# Patient Record
Sex: Female | Born: 1955 | Race: White | Hispanic: No | Marital: Married | Smoking: Former smoker
Health system: Southern US, Community
[De-identification: ages and names within clinical notes are randomized; demographics above are authoritative.]

## PROBLEM LIST (undated history)

## (undated) DIAGNOSIS — T7840XA Allergy, unspecified, initial encounter: Secondary | ICD-10-CM

## (undated) DIAGNOSIS — N200 Calculus of kidney: Secondary | ICD-10-CM

## (undated) DIAGNOSIS — Z9109 Other allergy status, other than to drugs and biological substances: Secondary | ICD-10-CM

## (undated) DIAGNOSIS — K219 Gastro-esophageal reflux disease without esophagitis: Secondary | ICD-10-CM

## (undated) DIAGNOSIS — L309 Dermatitis, unspecified: Secondary | ICD-10-CM

## (undated) HISTORY — DX: Calculus of kidney: N20.0

## (undated) HISTORY — PX: WISDOM TOOTH EXTRACTION: SHX21

## (undated) HISTORY — DX: Dermatitis, unspecified: L30.9

## (undated) HISTORY — DX: Gastro-esophageal reflux disease without esophagitis: K21.9

## (undated) HISTORY — DX: Allergy, unspecified, initial encounter: T78.40XA

## (undated) HISTORY — PX: LITHOTRIPSY: SUR834

## (undated) HISTORY — PX: TONSILLECTOMY: SUR1361

## (undated) HISTORY — DX: Other allergy status, other than to drugs and biological substances: Z91.09

---

## 1998-12-21 ENCOUNTER — Other Ambulatory Visit: Admission: RE | Admit: 1998-12-21 | Discharge: 1998-12-21 | Payer: Self-pay | Admitting: Internal Medicine

## 1999-02-08 ENCOUNTER — Encounter: Payer: Self-pay | Admitting: Internal Medicine

## 1999-02-08 ENCOUNTER — Encounter: Admission: RE | Admit: 1999-02-08 | Discharge: 1999-02-08 | Payer: Self-pay | Admitting: Internal Medicine

## 2001-01-29 ENCOUNTER — Encounter: Payer: Self-pay | Admitting: Internal Medicine

## 2001-01-29 ENCOUNTER — Encounter: Admission: RE | Admit: 2001-01-29 | Discharge: 2001-01-29 | Payer: Self-pay | Admitting: Internal Medicine

## 2002-08-12 ENCOUNTER — Other Ambulatory Visit: Admission: RE | Admit: 2002-08-12 | Discharge: 2002-08-12 | Payer: Self-pay | Admitting: Internal Medicine

## 2003-08-06 ENCOUNTER — Encounter: Admission: RE | Admit: 2003-08-06 | Discharge: 2003-08-06 | Payer: Self-pay | Admitting: Internal Medicine

## 2004-10-11 ENCOUNTER — Encounter: Admission: RE | Admit: 2004-10-11 | Discharge: 2004-10-11 | Payer: Self-pay | Admitting: Internal Medicine

## 2005-09-19 ENCOUNTER — Other Ambulatory Visit: Admission: RE | Admit: 2005-09-19 | Discharge: 2005-09-19 | Payer: Self-pay | Admitting: Internal Medicine

## 2005-10-12 ENCOUNTER — Ambulatory Visit: Payer: Self-pay | Admitting: Internal Medicine

## 2005-12-09 ENCOUNTER — Encounter (INDEPENDENT_AMBULATORY_CARE_PROVIDER_SITE_OTHER): Payer: Self-pay | Admitting: Specialist

## 2005-12-09 ENCOUNTER — Ambulatory Visit: Payer: Self-pay | Admitting: Internal Medicine

## 2006-05-25 ENCOUNTER — Encounter: Admission: RE | Admit: 2006-05-25 | Discharge: 2006-05-25 | Payer: Self-pay | Admitting: Internal Medicine

## 2007-06-22 ENCOUNTER — Encounter: Admission: RE | Admit: 2007-06-22 | Discharge: 2007-06-22 | Payer: Self-pay | Admitting: Internal Medicine

## 2008-07-28 ENCOUNTER — Encounter: Admission: RE | Admit: 2008-07-28 | Discharge: 2008-07-28 | Payer: Self-pay | Admitting: Internal Medicine

## 2008-09-22 ENCOUNTER — Other Ambulatory Visit: Admission: RE | Admit: 2008-09-22 | Discharge: 2008-09-22 | Payer: Self-pay | Admitting: Internal Medicine

## 2009-07-27 ENCOUNTER — Ambulatory Visit (HOSPITAL_COMMUNITY): Admission: RE | Admit: 2009-07-27 | Discharge: 2009-07-27 | Payer: Self-pay | Admitting: Urology

## 2009-08-03 ENCOUNTER — Encounter: Admission: RE | Admit: 2009-08-03 | Discharge: 2009-08-03 | Payer: Self-pay | Admitting: Internal Medicine

## 2009-08-31 ENCOUNTER — Ambulatory Visit (HOSPITAL_COMMUNITY): Admission: RE | Admit: 2009-08-31 | Discharge: 2009-08-31 | Payer: Self-pay | Admitting: Urology

## 2010-08-10 ENCOUNTER — Other Ambulatory Visit: Payer: Self-pay | Admitting: Internal Medicine

## 2010-08-10 DIAGNOSIS — Z1231 Encounter for screening mammogram for malignant neoplasm of breast: Secondary | ICD-10-CM

## 2010-08-17 ENCOUNTER — Ambulatory Visit
Admission: RE | Admit: 2010-08-17 | Discharge: 2010-08-17 | Disposition: A | Discharge status: Home / Self Care | Payer: BC Managed Care – PPO | Source: Ambulatory Visit | Attending: Internal Medicine | Admitting: Internal Medicine

## 2010-08-17 DIAGNOSIS — Z1231 Encounter for screening mammogram for malignant neoplasm of breast: Secondary | ICD-10-CM

## 2011-04-06 ENCOUNTER — Ambulatory Visit (INDEPENDENT_AMBULATORY_CARE_PROVIDER_SITE_OTHER): Payer: BC Managed Care – PPO | Admitting: Emergency Medicine

## 2011-04-06 VITALS — BP 110/68 | HR 80 | Temp 98.2°F | Resp 18 | Ht 64.0 in | Wt 115.0 lb

## 2011-04-06 DIAGNOSIS — B9789 Other viral agents as the cause of diseases classified elsewhere: Secondary | ICD-10-CM

## 2011-04-06 DIAGNOSIS — J329 Chronic sinusitis, unspecified: Secondary | ICD-10-CM

## 2011-04-06 DIAGNOSIS — N200 Calculus of kidney: Secondary | ICD-10-CM | POA: Insufficient documentation

## 2011-04-06 DIAGNOSIS — H9209 Otalgia, unspecified ear: Secondary | ICD-10-CM

## 2011-04-06 DIAGNOSIS — J019 Acute sinusitis, unspecified: Secondary | ICD-10-CM

## 2011-04-06 MED ORDER — AZELASTINE HCL 0.1 % NA SOLN: NASAL | Status: DC

## 2011-04-06 MED ORDER — AZITHROMYCIN 250 MG PO TABS: ORAL_TABLET | ORAL | Status: AC

## 2011-04-06 NOTE — Progress Notes (Signed)
  Subjective:    Patient ID: Yvonne Bryant, female    DOB: 15-Jul-1955, 56 y.o.   MRN: [id number]  HPI patient presents with head congestion nasal congestion postnasal drip and left ear pain. Last night the patient had an episode of vertigo which was associated with. Those symptoms have resolved today.    Review of Systems review of systems is noncontributory. She has a history kidney stones but otherwise has been in good health     Objective:   Physical Exam  Constitutional: She appears well-developed and well-nourished.  HENT:  Right Ear: External ear normal.       Examination left ear shows that the drum is somewhat dull but I did not see an air-fluid level or redness  Neck: Normal range of motion. No tracheal deviation present. No thyromegaly present.  Cardiovascular: Normal rate and regular rhythm.   Pulmonary/Chest: Effort normal and breath sounds normal. No respiratory distress. She has no wheezes. She has no rales. She exhibits no tenderness.  Lymphadenopathy:    She has no cervical adenopathy.          Assessment & Plan:   Assessment is an upper restaurant infection with probable left maxillary sinusitis and left ear pain. We'll treat with a Z-Pak and asked to prone see if we can open up her eustachian tube. I did advise her she could Bonine over-the-counter to use for her vertigo

## 2011-08-17 ENCOUNTER — Other Ambulatory Visit: Payer: Self-pay | Admitting: Internal Medicine

## 2011-08-17 DIAGNOSIS — Z1231 Encounter for screening mammogram for malignant neoplasm of breast: Secondary | ICD-10-CM

## 2011-08-30 ENCOUNTER — Ambulatory Visit
Admission: RE | Admit: 2011-08-30 | Discharge: 2011-08-30 | Disposition: A | Discharge status: Home / Self Care | Payer: BC Managed Care – PPO | Source: Ambulatory Visit | Attending: Internal Medicine | Admitting: Internal Medicine

## 2011-08-30 DIAGNOSIS — Z1231 Encounter for screening mammogram for malignant neoplasm of breast: Secondary | ICD-10-CM

## 2011-09-28 ENCOUNTER — Other Ambulatory Visit (HOSPITAL_COMMUNITY)
Admission: RE | Admit: 2011-09-28 | Discharge: 2011-09-28 | Disposition: A | Discharge status: Home / Self Care | Payer: BC Managed Care – PPO | Source: Ambulatory Visit | Attending: Internal Medicine | Admitting: Internal Medicine

## 2011-09-28 DIAGNOSIS — Z01419 Encounter for gynecological examination (general) (routine) without abnormal findings: Secondary | ICD-10-CM | POA: Insufficient documentation

## 2011-11-07 ENCOUNTER — Ambulatory Visit (INDEPENDENT_AMBULATORY_CARE_PROVIDER_SITE_OTHER): Payer: BC Managed Care – PPO | Admitting: Physician Assistant

## 2011-11-07 VITALS — BP 129/70 | HR 74 | Temp 98.1°F | Resp 16 | Ht 63.38 in | Wt 116.4 lb

## 2011-11-07 DIAGNOSIS — J019 Acute sinusitis, unspecified: Secondary | ICD-10-CM

## 2011-11-07 MED ORDER — IPRATROPIUM BROMIDE 0.06 % NA SOLN: 2.0000 | Freq: Three times a day (TID) | NASAL | Status: DC

## 2011-11-07 MED ORDER — AZITHROMYCIN 250 MG PO TABS: ORAL_TABLET | ORAL | Status: DC

## 2011-11-07 NOTE — Progress Notes (Signed)
Patient ID: Yvonne Bryant MRN: [id number], DOB: September 04, 1955, 56 y.o. Date of Encounter: 11/07/2011, 8:22 PM  Primary Physician: No primary provider on file.  Chief Complaint:  Chief Complaint  Patient presents with  . Nasal Congestion    x 2 weeks     HPI: 56 y.o. year old female presents with a 15 day history of post nasal drip. Symptoms began with a sore throat that lasted for a couple of days then progressed into nasal congestion for a few days. After the initial couple of days her sore throat resolved. Her nasal congestion has since resolved, however she is left with the post nasal drip. Never with much sinus pressure.  Afebrile. No chills. Post nasal drip is worse in the morning. Normal hearing. Has tried OTC cold preps without success. No GI complaints. Appetite normal.   No recent antibiotics, recent travels, or sick contacts   No leg trauma, sedentary periods, h/o cancer, or tobacco use.  Past Medical History  Diagnosis Date  . Kidney stones   . Environmental allergies      Home Meds: Prior to Admission medications   Medication Sig Start Date End Date Taking? Authorizing Provider  potassium citrate (UROCIT-K) 10 MEQ (1080 MG) SR tablet Take 10 mEq by mouth 3 (three) times daily with meals.   Yes Historical Provider, MD  azelastine (ASTELIN) 137 MCG/SPRAY nasal spray Use in each nostril as directed. Place 2 puffs each side of the nose twice a day 04/06/11   Collene Gobble, MD                  Allergies:  Allergies  Allergen Reactions  . Iodine   . Prednisone     History   Social History  . Marital Status: Single    Spouse Name: N/A    Number of Children: N/A  . Years of Education: N/A   Occupational History  . Not on file.   Social History Main Topics  . Smoking status: Former Smoker -- 1.0 packs/day for 20 years    Types: Cigarettes    Quit date: 04/06/2003  . Smokeless tobacco: Not on file  . Alcohol Use: Yes     rarely drinks  . Drug Use: No  .  Sexually Active: Yes     1 partner in last 12 months   Other Topics Concern  . Not on file   Social History Narrative  . No narrative on file     Review of Systems: Constitutional: negative for chills, fever, night sweats or weight changes Cardiovascular: negative for chest pain or palpitations Respiratory: negative for hemoptysis, wheezing, or shortness of breath Abdominal: negative for abdominal pain, nausea, vomiting or diarrhea Dermatological: negative for rash Neurologic: negative for headache   Physical Exam: Blood pressure 129/70, pulse 74, temperature 98.1 F (36.7 C), temperature source Oral, resp. rate 16, height 5' 3.38" (1.61 m), weight 116 lb 6.4 oz (52.799 kg), SpO2 100.00%., Body mass index is 20.37 kg/(m^2). General: Well developed, well nourished, in no acute distress. Head: Normocephalic, atraumatic, eyes without discharge, sclera non-icteric, nares are congested. Bilateral auditory canals clear, TM's are without perforation, pearly grey with reflective cone of light bilaterally. Maxillary sinus TTP. Oral cavity moist, dentition normal. Posterior pharynx with post nasal drip and mild erythema. No peritonsillar abscess or tonsillar exudate. Uvula midline.  Neck: Supple. No thyromegaly. Full ROM. No lymphadenopathy. Lungs: Clear bilaterally to auscultation without wheezes, rales, or rhonchi. Breathing is unlabored.  Heart: RRR  with S1 S2. No murmurs, rubs, or gallops appreciated. Msk:  Strength and tone normal for age. Extremities: No clubbing or cyanosis. No edema. Neuro: Alert and oriented X 3. Moves all extremities spontaneously. CNII-XII grossly in tact. Psych:  Responds to questions appropriately with a normal affect.     ASSESSMENT AND PLAN:  56 y.o. year old female with sinusitis -Azithromycin 250 MG #6 2 po first day then 1 po next 4 days no RF -Atrovent NS 0.06% 2 sprays each nare bid prn #1 no RF -Tylenol/Motrin prn -Rest/fluids -RTC precautions -RTC  3-5 days if no improvement  Signed, Eula Listen, PA-C 11/07/2011 8:22 PM

## 2012-08-01 ENCOUNTER — Other Ambulatory Visit: Payer: Self-pay

## 2012-08-01 DIAGNOSIS — Z1231 Encounter for screening mammogram for malignant neoplasm of breast: Secondary | ICD-10-CM

## 2012-08-02 ENCOUNTER — Ambulatory Visit (INDEPENDENT_AMBULATORY_CARE_PROVIDER_SITE_OTHER): Payer: BC Managed Care – PPO | Admitting: Emergency Medicine

## 2012-08-02 VITALS — BP 120/60 | HR 65 | Temp 97.8°F | Resp 16 | Ht 63.5 in | Wt 118.4 lb

## 2012-08-02 DIAGNOSIS — M791 Myalgia, unspecified site: Secondary | ICD-10-CM

## 2012-08-02 DIAGNOSIS — IMO0001 Reserved for inherently not codable concepts without codable children: Secondary | ICD-10-CM

## 2012-08-02 NOTE — Progress Notes (Signed)
  Subjective:    Patient ID: Yvonne Bryant, female    DOB: 03/02/1955, 57 y.o.   MRN: [id number]  HPI  Pt presents with sxs that started 20 days ago with extreme fatigue, leg heaviness, HA that lasted up until 3 days ago, and blurry vision that lasted a week and is now coming and going No fever, cough, myalgias, rashes or tick bites. 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   Review of Systems     Objective:   Physical Exam        Assessment & Plan:

## 2012-09-03 ENCOUNTER — Ambulatory Visit: Payer: BC Managed Care – PPO

## 2012-09-12 ENCOUNTER — Encounter: Payer: Self-pay | Admitting: Internal Medicine

## 2012-09-18 ENCOUNTER — Ambulatory Visit
Admission: RE | Admit: 2012-09-18 | Discharge: 2012-09-18 | Disposition: A | Discharge status: Home / Self Care | Payer: BC Managed Care – PPO | Source: Ambulatory Visit

## 2012-09-18 DIAGNOSIS — Z1231 Encounter for screening mammogram for malignant neoplasm of breast: Secondary | ICD-10-CM

## 2013-04-05 ENCOUNTER — Ambulatory Visit (INDEPENDENT_AMBULATORY_CARE_PROVIDER_SITE_OTHER): Payer: BC Managed Care – PPO | Admitting: Physician Assistant

## 2013-04-05 VITALS — BP 110/70 | HR 91 | Temp 98.3°F | Resp 18 | Ht 62.0 in | Wt 122.0 lb

## 2013-04-05 DIAGNOSIS — M545 Low back pain, unspecified: Secondary | ICD-10-CM

## 2013-04-05 DIAGNOSIS — N390 Urinary tract infection, site not specified: Secondary | ICD-10-CM

## 2013-04-05 DIAGNOSIS — R6883 Chills (without fever): Secondary | ICD-10-CM

## 2013-04-05 DIAGNOSIS — R300 Dysuria: Secondary | ICD-10-CM

## 2013-04-05 LAB — POCT UA - MICROSCOPIC ONLY
Casts, Ur, LPF, POC: NEGATIVE
Crystals, Ur, HPF, POC: NEGATIVE
MUCUS UA: NEGATIVE
Yeast, UA: NEGATIVE

## 2013-04-05 LAB — POCT URINALYSIS DIPSTICK
BILIRUBIN UA: NEGATIVE
GLUCOSE UA: NEGATIVE
KETONES UA: NEGATIVE
NITRITE UA: NEGATIVE
Protein, UA: 30
SPEC GRAV UA: 1.02
Urobilinogen, UA: 0.2
pH, UA: 6

## 2013-04-05 MED ORDER — NITROFURANTOIN MONOHYD MACRO 100 MG PO CAPS: 100.0000 mg | ORAL_CAPSULE | Freq: Two times a day (BID) | ORAL | Status: DC

## 2013-04-05 NOTE — Progress Notes (Signed)
   Subjective:    Patient ID: Yvonne Bryant, female    DOB: [birth year], 58 y.o.   MRN: [id number]  HPI Pt presents to clinic with about 1 week h/o urinary symptoms consistent with a UTI.  She had an UTI about 1 year ago.  She developed some low back pain last pm but it was bilateral.  She has used no OTC medications.  Review of Systems  Gastrointestinal: Positive for abdominal pain.  Genitourinary: Positive for dysuria, urgency and frequency. Negative for vaginal discharge.  Musculoskeletal: Positive for back pain.       Objective:   Physical Exam  Vitals reviewed. Constitutional: She is oriented to person, place, and time. She appears well-developed and well-nourished.  HENT:  Head: Normocephalic and atraumatic.  Right Ear: External ear normal.  Left Ear: External ear normal.  Eyes: Conjunctivae are normal.  Cardiovascular: Normal rate, regular rhythm and normal heart sounds.   No murmur heard. Pulmonary/Chest: Effort normal and breath sounds normal. She has no wheezes.  Abdominal: Soft. There is tenderness (mild) in the suprapubic area. There is no CVA tenderness.  Neurological: She is alert and oriented to person, place, and time.  Skin: Skin is warm and dry.  Psychiatric: She has a normal mood and affect. Her behavior is normal. Judgment and thought content normal.   Results for orders placed in visit on 04/05/13  POCT URINALYSIS DIPSTICK      Result Value Ref Range   Color, UA yellow     Clarity, UA cloudy     Glucose, UA neg     Bilirubin, UA neg     Ketones, UA neg     Spec Grav, UA 1.020     Blood, UA mod.     pH, UA 6.0     Protein, UA 30     Urobilinogen, UA 0.2     Nitrite, UA neg     Leukocytes, UA large (3+)    POCT UA - MICROSCOPIC ONLY      Result Value Ref Range   WBC, Ur, HPF, POC 1-4     RBC, urine, microscopic 0-2     Bacteria, U Microscopic 1+     Mucus, UA neg     Epithelial cells, urine per micros 0-4     Crystals, Ur, HPF, POC neg     Casts, Ur, LPF, POC neg     Yeast, UA neg         Assessment & Plan:  Burning with urination - Plan: POCT urinalysis dipstick, POCT UA - Microscopic Only  Lower back pain - Plan: POCT urinalysis dipstick, POCT UA - Microscopic Only  Chills - Plan: POCT urinalysis dipstick, POCT UA - Microscopic Only  UTI (urinary tract infection) - Plan: nitrofurantoin, macrocrystal-monohydrate, (MACROBID) 100 MG capsule, Urine culture  Push fluids -   Windell Hummingbird PA-C  [facility] 04/05/2013 8:46 AM

## 2013-04-07 LAB — URINE CULTURE: Colony Count: >=100000

## 2013-04-08 ENCOUNTER — Other Ambulatory Visit: Payer: Self-pay | Admitting: Physician Assistant

## 2013-04-08 ENCOUNTER — Other Ambulatory Visit: Payer: Self-pay

## 2013-04-08 MED ORDER — CIPROFLOXACIN HCL 500 MG PO TABS: 500.0000 mg | ORAL_TABLET | Freq: Two times a day (BID) | ORAL | Status: DC

## 2013-06-05 ENCOUNTER — Other Ambulatory Visit: Payer: Self-pay | Admitting: Urology

## 2013-06-17 ENCOUNTER — Encounter (HOSPITAL_COMMUNITY): Payer: Self-pay | Admitting: Pharmacy Technician

## 2013-07-04 ENCOUNTER — Encounter (HOSPITAL_COMMUNITY): Payer: Self-pay | Admitting: *Deleted

## 2013-07-08 ENCOUNTER — Ambulatory Visit (HOSPITAL_COMMUNITY)
Admission: RE | Admit: 2013-07-08 | Discharge: 2013-07-08 | Disposition: A | Discharge status: Home / Self Care | Payer: BC Managed Care – PPO | Source: Ambulatory Visit | Attending: Urology | Admitting: Urology

## 2013-07-08 ENCOUNTER — Ambulatory Visit (HOSPITAL_COMMUNITY): Payer: BC Managed Care – PPO

## 2013-07-08 ENCOUNTER — Encounter (HOSPITAL_COMMUNITY)
Admission: RE | Disposition: A | Discharge status: Home / Self Care | Payer: Self-pay | Source: Ambulatory Visit | Attending: Urology

## 2013-07-08 ENCOUNTER — Encounter (HOSPITAL_COMMUNITY): Payer: Self-pay | Admitting: General Practice

## 2013-07-08 DIAGNOSIS — Z79899 Other long term (current) drug therapy: Secondary | ICD-10-CM | POA: Insufficient documentation

## 2013-07-08 DIAGNOSIS — N201 Calculus of ureter: Secondary | ICD-10-CM | POA: Insufficient documentation

## 2013-07-08 DIAGNOSIS — N200 Calculus of kidney: Secondary | ICD-10-CM | POA: Insufficient documentation

## 2013-07-08 DIAGNOSIS — Z87891 Personal history of nicotine dependence: Secondary | ICD-10-CM | POA: Insufficient documentation

## 2013-07-08 DIAGNOSIS — R3129 Other microscopic hematuria: Secondary | ICD-10-CM | POA: Insufficient documentation

## 2013-07-08 SURGERY — LITHOTRIPSY, ESWL
Anesthesia: LOCAL | Laterality: Left

## 2013-07-08 MED ORDER — DEXTROSE-NACL 5-0.45 % IV SOLN
INTRAVENOUS | Status: DC
  Administered 2013-07-08: 10:00:00 via INTRAVENOUS

## 2013-07-08 MED ORDER — CIPROFLOXACIN HCL 500 MG PO TABS
500.0000 mg | ORAL_TABLET | ORAL | Status: AC
  Administered 2013-07-08: 500 mg via ORAL
  Filled 2013-07-08: qty 1

## 2013-07-08 MED ORDER — DIPHENHYDRAMINE HCL 25 MG PO CAPS
25.0000 mg | ORAL_CAPSULE | ORAL | Status: AC
  Administered 2013-07-08: 25 mg via ORAL
  Filled 2013-07-08: qty 1

## 2013-07-08 MED ORDER — DIAZEPAM 5 MG PO TABS
10.0000 mg | ORAL_TABLET | ORAL | Status: AC
  Administered 2013-07-08: 10 mg via ORAL
  Filled 2013-07-08: qty 2

## 2013-07-08 NOTE — H&P (Signed)
Reason For Visit 6 mo f/u & KUB   Active Problems Problems  1. Bilateral kidney stones (592.0)   Assessed By: Jimmey Ralph (Urology); Last Assessed: 02 Nov 2012 2. Microscopic hematuria (599.72)   Assessed By: Jimmey Ralph (Urology); Last Assessed: 02 Nov 2012  History of Present Illness    58 YO female returns today for a 6 mo f/u & KUB with a hx of bilateral kidney stones & microhematuria. Currently taking Potassium Citrate 5 MEQ 3 po QID. S/P Lt lithotripsy for L renal stone on 08/31/09 and post R lithotripsy 07/27/09.   Past Medical History Problems  1. History of No Medical Problems  Surgical History Problems  1. History of Lithotripsy 2. History of Lithotripsy  Current Meds 1. Potassium Citrate ER 5 MEQ (540 MG) Oral Tablet Extended Release; TAKE 3 TABLETS  4 TIMES DAILY WITH MEALS;  Therapy: [id number] to (Evaluate:19Oct2015)  Requested for: 24Oct2014; Last  Rx:24Oct2014 Ordered  Allergies Medication  1. PredniSONE (Pak) TABS Non-Medication  2. Shellfish  Family History Problems  1. Family history of Family Health Status - Father's Age   61 2. Family history of Mother Deceased At Age ____   46 3. Family history of Uterine Cancer (V16.49) : Mother  Social History Problems  1. Denied: History of Alcohol Use 2. Denied: History of Caffeine Use 3. Former smoker (V15.82)   1 ppd x 23 yrs, quit 6 yrs ago 4. Marital History - Currently Married 5. Occupation:   Glass blower/designer  Review of Systems Genitourinary, constitutional, skin, eye, otolaryngeal, hematologic/lymphatic, cardiovascular, pulmonary, endocrine, musculoskeletal, gastrointestinal, neurological and psychiatric system(s) were reviewed and pertinent findings if present are noted.    Vitals Vital Signs [Data Includes: Last 1 Day]  Recorded: 29Apr2015 03:02PM  Blood Pressure: 121 / 72 Temperature: 98.2 F Heart Rate: 97  Physical Exam Constitutional: Well nourished and well developed . No  acute distress.  ENT:. The ears and nose are normal in appearance.  Neck: The appearance of the neck is normal and no neck mass is present.  Pulmonary: No respiratory distress and normal respiratory rhythm and effort.  Cardiovascular:. No peripheral edema.  Abdomen: The abdomen is flat. The abdomen is soft and nontender. No masses are palpated. No CVA tenderness. No hernias are palpable. No hepatosplenomegaly noted.  Lymphatics: The femoral and inguinal nodes are not enlarged or tender.  Skin: Normal skin turgor, no visible rash and no visible skin lesions.  Neuro/Psych:. Mood and affect are appropriate.    Results/Data  KUB: Suspicious 1cm L renal pelvic Ca++, medial and lateral, vs stool overlying the kidney. Ribs: no evidence for fx. Gas pattern normal.   08 May 2013 2:33 PM    UA With REFLEX      COLOR YELLOW      APPEARANCE CLEAR      SPECIFIC GRAVITY 1.010      pH 6.0      GLUCOSE NEG      BILIRUBIN NEG      KETONE NEG      BLOOD NEG      PROTEIN NEG      UROBILINOGEN 0.2      NITRITE NEG      LEUKOCYTE ESTERASE NEG  Assessment Assessed  1. Kidney stone on left side (592.0)  Suspicious KUB for L renal stones. She needs CT stone protocol.   Plan Kidney stone on left side  1. AU CT-STONE PROTOCOL; Status:Hold For - Appointment,PreCert,Date of Service,Print;  Requested for:29Apr2015;   CT  stone protocol. We will call with results.   Discussion/Summary cc: [facility] [place]   cc: Dr. Grayland Ormond     Signatures

## 2013-07-08 NOTE — Interval H&P Note (Signed)
History and Physical Interval Note:  07/08/2013 9:09 AM  Yvonne Bryant  has presented today for surgery, with the diagnosis of left renal and ureteral stone  The various methods of treatment have been discussed with the patient and family. After consideration of risks, benefits and other options for treatment, the patient has consented to  Procedure(s): LEFT EXTRACORPOREAL SHOCK WAVE LITHOTRIPSY (ESWL) (Left) as a surgical intervention .  The patient's history has been reviewed, patient examined, no change in status, stable for surgery.  I have reviewed the patient's chart and labs.  Questions were answered to the patient's satisfaction.     Carolan Clines I

## 2013-08-13 ENCOUNTER — Other Ambulatory Visit: Payer: Self-pay

## 2013-08-13 DIAGNOSIS — Z1231 Encounter for screening mammogram for malignant neoplasm of breast: Secondary | ICD-10-CM

## 2013-09-20 ENCOUNTER — Ambulatory Visit
Admission: RE | Admit: 2013-09-20 | Discharge: 2013-09-20 | Disposition: A | Discharge status: Home / Self Care | Payer: BC Managed Care – PPO | Source: Ambulatory Visit

## 2013-09-20 ENCOUNTER — Encounter (INDEPENDENT_AMBULATORY_CARE_PROVIDER_SITE_OTHER): Payer: Self-pay

## 2013-09-20 DIAGNOSIS — Z1231 Encounter for screening mammogram for malignant neoplasm of breast: Secondary | ICD-10-CM

## 2014-07-26 ENCOUNTER — Ambulatory Visit (INDEPENDENT_AMBULATORY_CARE_PROVIDER_SITE_OTHER): Payer: BLUE CROSS/BLUE SHIELD | Admitting: Family Medicine

## 2014-07-26 VITALS — BP 112/80 | HR 80 | Temp 98.2°F | Resp 16 | Ht 64.0 in | Wt 122.6 lb

## 2014-07-26 DIAGNOSIS — N3090 Cystitis, unspecified without hematuria: Secondary | ICD-10-CM

## 2014-07-26 DIAGNOSIS — R300 Dysuria: Secondary | ICD-10-CM

## 2014-07-26 DIAGNOSIS — Z87442 Personal history of urinary calculi: Secondary | ICD-10-CM

## 2014-07-26 LAB — POCT URINALYSIS DIPSTICK
Bilirubin, UA: NEGATIVE
GLUCOSE UA: NEGATIVE
Ketones, UA: NEGATIVE
Nitrite, UA: POSITIVE
Protein, UA: 100
SPEC GRAV UA: 1.015
UROBILINOGEN UA: 0.2
pH, UA: 6

## 2014-07-26 LAB — POCT UA - MICROSCOPIC ONLY
Casts, Ur, LPF, POC: NEGATIVE
Crystals, Ur, HPF, POC: NEGATIVE
Mucus, UA: NEGATIVE
YEAST UA: NEGATIVE

## 2014-07-26 MED ORDER — SULFAMETHOXAZOLE-TRIMETHOPRIM 800-160 MG PO TABS: 1.0000 | ORAL_TABLET | Freq: Two times a day (BID) | ORAL | Status: DC

## 2014-07-26 NOTE — Patient Instructions (Signed)
Drink plenty of fluids  Take sulfamethoxazole (Bactrim) one twice daily  Return if worse at any time, especially if fever, chills, flank pain. Go to the emergency room if necessary.  Urinary Tract Infection Urinary tract infections (UTIs) can develop anywhere along your urinary tract. Your urinary tract is your body's drainage system for removing wastes and extra water. Your urinary tract includes two kidneys, two ureters, a bladder, and a urethra. Your kidneys are a pair of bean-shaped organs. Each kidney is about the size of your fist. They are located below your ribs, one on each side of your spine. CAUSES Infections are caused by microbes, which are microscopic organisms, including fungi, viruses, and bacteria. These organisms are so small that they can only be seen through a microscope. Bacteria are the microbes that most commonly cause UTIs. SYMPTOMS  Symptoms of UTIs may vary by age and gender of the patient and by the location of the infection. Symptoms in young women typically include a frequent and intense urge to urinate and a painful, burning feeling in the bladder or urethra during urination. Older women and men are more likely to be tired, shaky, and weak and have muscle aches and abdominal pain. A fever may mean the infection is in your kidneys. Other symptoms of a kidney infection include pain in your back or sides below the ribs, nausea, and vomiting. DIAGNOSIS To diagnose a UTI, your caregiver will ask you about your symptoms. Your caregiver also will ask to provide a urine sample. The urine sample will be tested for bacteria and white blood cells. White blood cells are made by your body to help fight infection. TREATMENT  Typically, UTIs can be treated with medication. Because most UTIs are caused by a bacterial infection, they usually can be treated with the use of antibiotics. The choice of antibiotic and length of treatment depend on your symptoms and the type of bacteria causing  your infection. HOME CARE INSTRUCTIONS  If you were prescribed antibiotics, take them exactly as your caregiver instructs you. Finish the medication even if you feel better after you have only taken some of the medication.  Drink enough water and fluids to keep your urine clear or pale yellow.  Avoid caffeine, tea, and carbonated beverages. They tend to irritate your bladder.  Empty your bladder often. Avoid holding urine for long periods of time.  Empty your bladder before and after sexual intercourse.  After a bowel movement, women should cleanse from front to back. Use each tissue only once. SEEK MEDICAL CARE IF:   You have back pain.  You develop a fever.  Your symptoms do not begin to resolve within 3 days. SEEK IMMEDIATE MEDICAL CARE IF:   You have severe back pain or lower abdominal pain.  You develop chills.  You have nausea or vomiting.  You have continued burning or discomfort with urination. MAKE SURE YOU:   Understand these instructions.  Will watch your condition.  Will get help right away if you are not doing well or get worse. Document Released: 10/06/2004 Document Revised: 06/28/2011 Document Reviewed: 02/04/2011 [facility] Patient Information 2015 [place], [place]. This information is not intended to replace advice given to you by your health care provider. Make sure you discuss any questions you have with your health care provider.

## 2014-07-26 NOTE — Progress Notes (Addendum)
Subjective:  Patient ID: Yvonne Bryant, female    DOB: Apr 28, 1955  Age: 59 y.o. MRN: [id number]  59 year old lady who has a several day history of dysuria and frequency. No fever or chills. She's had UTIs in the past. Last years UTI, Proteus, do not respond to the initial treatment which was nitrofurantoin but did respond to whether also change her to. I cannot find which she was changed to.   Objective:   No CVA tenderness. Abdomen soft without mass or tenderness.  Results for orders placed or performed in visit on 07/26/14  Urine culture  Result Value Ref Range   Culture ESCHERICHIA COLI    Colony Count >=100,000 COLONIES/ML    Organism ID, Bacteria ESCHERICHIA COLI       Susceptibility   Escherichia coli -  (no method available)    AMPICILLIN <=2 Sensitive     AMOX/CLAVULANIC <=2 Sensitive     AMPICILLIN/SULBACTAM <=2 Sensitive     PIP/TAZO <=4 Sensitive     IMIPENEM <=0.25 Sensitive     CEFAZOLIN <=4 Sensitive     CEFTRIAXONE <=1 Sensitive     CEFTAZIDIME <=1 Sensitive     CEFEPIME <=1 Sensitive     GENTAMICIN <=1 Sensitive     TOBRAMYCIN <=1 Sensitive     CIPROFLOXACIN <=0.25 Sensitive     LEVOFLOXACIN <=0.12 Sensitive     NITROFURANTOIN 32 Sensitive     TRIMETH/SULFA <=20 Sensitive   POCT UA - Microscopic Only  Result Value Ref Range   WBC, Ur, HPF, POC tntc    RBC, urine, microscopic few    Bacteria, U Microscopic 4+    Mucus, UA neg    Epithelial cells, urine per micros 1-2    Crystals, Ur, HPF, POC neg    Casts, Ur, LPF, POC neg    Yeast, UA neg   POCT urinalysis dipstick  Result Value Ref Range   Color, UA yellow    Clarity, UA cloudy    Glucose, UA neg    Bilirubin, UA neg    Ketones, UA neg    Spec Grav, UA 1.015    Blood, UA moderate    pH, UA 6.0    Protein, UA 100    Urobilinogen, UA 0.2    Nitrite, UA positive    Leukocytes, UA large (3+) (A) Negative    Assessment: Cystitis Dysuria  Plan: Patient Instructions  Drink plenty of  fluids  Take sulfamethoxazole (Bactrim) one twice daily  Return if worse at any time, especially if fever, chills, flank pain. Go to the emergency room if necessary.  Urinary Tract Infection Urinary tract infections (UTIs) can develop anywhere along your urinary tract. Your urinary tract is your body's drainage system for removing wastes and extra water. Your urinary tract includes two kidneys, two ureters, a bladder, and a urethra. Your kidneys are a pair of bean-shaped organs. Each kidney is about the size of your fist. They are located below your ribs, one on each side of your spine. CAUSES Infections are caused by microbes, which are microscopic organisms, including fungi, viruses, and bacteria. These organisms are so small that they can only be seen through a microscope. Bacteria are the microbes that most commonly cause UTIs. SYMPTOMS  Symptoms of UTIs may vary by age and gender of the patient and by the location of the infection. Symptoms in young women typically include a frequent and intense urge to urinate and a painful, burning feeling in the bladder or urethra during urination.  Older women and men are more likely to be tired, shaky, and weak and have muscle aches and abdominal pain. A fever may mean the infection is in your kidneys. Other symptoms of a kidney infection include pain in your back or sides below the ribs, nausea, and vomiting. DIAGNOSIS To diagnose a UTI, your caregiver will ask you about your symptoms. Your caregiver also will ask to provide a urine sample. The urine sample will be tested for bacteria and white blood cells. White blood cells are made by your body to help fight infection. TREATMENT  Typically, UTIs can be treated with medication. Because most UTIs are caused by a bacterial infection, they usually can be treated with the use of antibiotics. The choice of antibiotic and length of treatment depend on your symptoms and the type of bacteria causing your  infection. HOME CARE INSTRUCTIONS  If you were prescribed antibiotics, take them exactly as your caregiver instructs you. Finish the medication even if you feel better after you have only taken some of the medication.  Drink enough water and fluids to keep your urine clear or pale yellow.  Avoid caffeine, tea, and carbonated beverages. They tend to irritate your bladder.  Empty your bladder often. Avoid holding urine for long periods of time.  Empty your bladder before and after sexual intercourse.  After a bowel movement, women should cleanse from front to back. Use each tissue only once. SEEK MEDICAL CARE IF:   You have back pain.  You develop a fever.  Your symptoms do not begin to resolve within 3 days. SEEK IMMEDIATE MEDICAL CARE IF:   You have severe back pain or lower abdominal pain.  You develop chills.  You have nausea or vomiting.  You have continued burning or discomfort with urination. MAKE SURE YOU:   Understand these instructions.  Will watch your condition.  Will get help right away if you are not doing well or get worse. Document Released: 10/06/2004 Document Revised: 06/28/2011 Document Reviewed: 02/04/2011 [facility] Patient Information 2015 [place], [place]. This information is not intended to replace advice given to you by your health care provider. Make sure you discuss any questions you have with your health care provider.      HOPPER,DAVID, MD 08/01/2014

## 2014-07-28 LAB — URINE CULTURE: Colony Count: >=100000

## 2014-08-11 ENCOUNTER — Ambulatory Visit (INDEPENDENT_AMBULATORY_CARE_PROVIDER_SITE_OTHER): Payer: BLUE CROSS/BLUE SHIELD | Admitting: Emergency Medicine

## 2014-08-11 VITALS — BP 118/64 | HR 78 | Temp 97.9°F | Resp 16 | Ht 64.0 in | Wt 121.8 lb

## 2014-08-11 DIAGNOSIS — R300 Dysuria: Secondary | ICD-10-CM | POA: Diagnosis not present

## 2014-08-11 DIAGNOSIS — N3090 Cystitis, unspecified without hematuria: Secondary | ICD-10-CM

## 2014-08-11 LAB — POCT URINALYSIS DIPSTICK
Bilirubin, UA: NEGATIVE
Glucose, UA: NEGATIVE
Ketones, UA: NEGATIVE
Nitrite, UA: POSITIVE
PH UA: 5.5
PROTEIN UA: 100
Spec Grav, UA: 1.01
UROBILINOGEN UA: 0.2

## 2014-08-11 LAB — POCT UA - MICROSCOPIC ONLY
CASTS, UR, LPF, POC: NEGATIVE
CRYSTALS, UR, HPF, POC: NEGATIVE
Epithelial cells, urine per micros: NEGATIVE
Mucus, UA: NEGATIVE

## 2014-08-11 MED ORDER — PHENAZOPYRIDINE HCL 200 MG PO TABS: 200.0000 mg | ORAL_TABLET | Freq: Three times a day (TID) | ORAL | Status: DC | PRN

## 2014-08-11 MED ORDER — CIPROFLOXACIN HCL 500 MG PO TABS: 500.0000 mg | ORAL_TABLET | Freq: Two times a day (BID) | ORAL | Status: DC

## 2014-08-11 NOTE — Patient Instructions (Signed)

## 2014-08-11 NOTE — Progress Notes (Signed)
Subjective:  Patient ID: Yvonne Bryant, female    DOB: [birth year]  Age: 59 y.o. MRN: [id number]  CC: Urinary Tract Infection and Abdominal Pain   HPI Yvonne Bryant was treated 2 weeks ago with Septra for urinary tract infection she completed all 5 days Yvonne Bryant course of treatment and her culture results showed that she was sensitive to everything with 100,000 colony-forming units of the Escherichia coli.. She  History Yvonne Bryant has a past medical history of Kidney stones and Environmental allergies.   She has no past surgical history on file.   Her  family history includes Skin cancer in an other family member; Uterine cancer in an other family member.  She   reports that she quit smoking about 11 years ago. Her smoking use included Cigarettes. She has a 20 pack-year smoking history. She does not have any smokeless tobacco history on file. She reports that she does not drink alcohol or use illicit drugs.  Outpatient Prescriptions Prior to Visit  Medication Sig Dispense Refill  . aspirin EC 81 MG tablet Take 81 mg by mouth daily as needed (headache).     . sulfamethoxazole-trimethoprim (BACTRIM DS,SEPTRA DS) 800-160 MG per tablet Take 1 tablet by mouth 2 (two) times daily. 10 tablet 0  . naproxen sodium (ANAPROX) 220 MG tablet Take 220 mg by mouth 2 (two) times daily as needed (PAIN).    Marland Kitchen potassium citrate (UROCIT-K) 10 MEQ (1080 MG) SR tablet Take 10 mEq by mouth every morning.      No facility-administered medications prior to visit.    History   Social History  . Marital Status: Married    Spouse Name: N/A  . Number of Children: N/A  . Years of Education: N/A   Social History Main Topics  . Smoking status: Former Smoker -- 1.00 packs/day for 20 years    Types: Cigarettes    Quit date: 04/06/2003  . Smokeless tobacco: Not on file  . Alcohol Use: No     Comment: rarely drinks  . Drug Use: No  . Sexual Activity: Yes     Comment: 1 partner in last 12 months   Other  Topics Concern  . None   Social History Narrative     Review of Systems  Constitutional: Negative for fever, chills and appetite change.  HENT: Negative for congestion, ear pain, postnasal drip, sinus pressure and sore throat.   Eyes: Negative for pain and redness.  Respiratory: Negative for cough, shortness of breath and wheezing.   Cardiovascular: Negative for leg swelling.  Gastrointestinal: Negative for nausea, vomiting, abdominal pain, diarrhea, constipation and blood in stool.  Endocrine: Negative for polyuria.  Genitourinary: Positive for dysuria, urgency and frequency. Negative for flank pain.  Musculoskeletal: Negative for gait problem.  Skin: Negative for rash.  Neurological: Negative for weakness and headaches.  Psychiatric/Behavioral: Negative for confusion and decreased concentration. The patient is not nervous/anxious.     Objective:  BP 118/64 mmHg  Pulse 78  Temp(Src) 97.9 F (36.6 C) (Oral)  Resp 16  Ht 5\' 4"  (1.626 m)  Wt 121 lb 12.8 oz (55.248 kg)  BMI 20.90 kg/m2  SpO2 97%  Physical Exam  Constitutional: She is oriented to person, place, and time. She appears well-developed and well-nourished.  HENT:  Head: Normocephalic and atraumatic.  Eyes: Conjunctivae are normal. Pupils are equal, round, and reactive to light.  Pulmonary/Chest: Effort normal.  Musculoskeletal: She exhibits no edema.  Neurological: She is alert and oriented to person,  place, and time.  Skin: Skin is dry.  Psychiatric: She has a normal mood and affect. Her behavior is normal. Thought content normal.      Assessment & Plan:   Yvonne Bryant was seen today for urinary tract infection and abdominal pain.  Diagnoses and all orders for this visit:  Dysuria Orders: -     POCT urinalysis dipstick -     POCT UA - Microscopic Only  Cystitis Orders: -     POCT urinalysis dipstick -     POCT UA - Microscopic Only -     Urine culture  Other orders -     ciprofloxacin (CIPRO) 500 MG  tablet; Take 1 tablet (500 mg total) by mouth 2 (two) times daily. -     phenazopyridine (PYRIDIUM) 200 MG tablet; Take 1 tablet (200 mg total) by mouth 3 (three) times daily as needed.   I am having Yvonne Bryant start on ciprofloxacin and phenazopyridine. I am also having her maintain her potassium citrate, naproxen sodium, aspirin EC, and sulfamethoxazole-trimethoprim.  Meds ordered this encounter  Medications  . ciprofloxacin (CIPRO) 500 MG tablet    Sig: Take 1 tablet (500 mg total) by mouth 2 (two) times daily.    Dispense:  20 tablet    Refill:  0  . phenazopyridine (PYRIDIUM) 200 MG tablet    Sig: Take 1 tablet (200 mg total) by mouth 3 (three) times daily as needed.    Dispense:  6 tablet    Refill:  0    Appropriate red flag conditions were discussed with the patient as well as actions that should be taken.  Patient expressed his understanding.  Follow-up: Return if symptoms worsen or fail to improve.  Roselee Culver, MD   Results for orders placed or performed in visit on 08/11/14  POCT urinalysis dipstick  Result Value Ref Range   Color, UA yellow    Clarity, UA cloudy    Glucose, UA neg    Bilirubin, UA neg    Ketones, UA neg    Spec Grav, UA 1.010    Blood, UA mod    pH, UA 5.5    Protein, UA 100    Urobilinogen, UA 0.2    Nitrite, UA pos    Leukocytes, UA moderate (2+) (A) Negative  POCT UA - Microscopic Only  Result Value Ref Range   WBC, Ur, HPF, POC tntc    RBC, urine, microscopic tntc    Bacteria, U Microscopic 2+    Mucus, UA neg    Epithelial cells, urine per micros neg    Crystals, Ur, HPF, POC neg    Casts, Ur, LPF, POC neg    Yeast, UA buds

## 2014-08-13 LAB — URINE CULTURE: Colony Count: >=100000

## 2014-08-14 NOTE — Addendum Note (Signed)
Addended by: Roselee Culver on: 08/14/2014 02:16 PM   Modules accepted: Miquel Dunn

## 2014-08-15 ENCOUNTER — Telehealth: Payer: Self-pay | Admitting: *Deleted

## 2014-08-15 NOTE — Telephone Encounter (Signed)
Pt husband notified about labs

## 2014-08-20 ENCOUNTER — Other Ambulatory Visit: Payer: Self-pay

## 2014-08-20 DIAGNOSIS — Z1231 Encounter for screening mammogram for malignant neoplasm of breast: Secondary | ICD-10-CM

## 2014-09-23 ENCOUNTER — Ambulatory Visit
Admission: RE | Admit: 2014-09-23 | Discharge: 2014-09-23 | Disposition: A | Discharge status: Home / Self Care | Payer: BLUE CROSS/BLUE SHIELD | Source: Ambulatory Visit

## 2014-09-23 DIAGNOSIS — Z1231 Encounter for screening mammogram for malignant neoplasm of breast: Secondary | ICD-10-CM

## 2014-10-03 ENCOUNTER — Other Ambulatory Visit (HOSPITAL_COMMUNITY)
Admission: RE | Admit: 2014-10-03 | Discharge: 2014-10-03 | Disposition: A | Discharge status: Home / Self Care | Payer: BLUE CROSS/BLUE SHIELD | Source: Ambulatory Visit | Attending: Internal Medicine | Admitting: Internal Medicine

## 2014-10-03 DIAGNOSIS — Z01419 Encounter for gynecological examination (general) (routine) without abnormal findings: Secondary | ICD-10-CM | POA: Diagnosis not present

## 2015-08-05 ENCOUNTER — Ambulatory Visit (INDEPENDENT_AMBULATORY_CARE_PROVIDER_SITE_OTHER): Payer: BLUE CROSS/BLUE SHIELD | Admitting: Physician Assistant

## 2015-08-05 VITALS — BP 110/74 | HR 78 | Temp 98.6°F | Resp 16 | Ht 64.0 in | Wt 127.0 lb

## 2015-08-05 DIAGNOSIS — R1030 Lower abdominal pain, unspecified: Secondary | ICD-10-CM | POA: Diagnosis not present

## 2015-08-05 DIAGNOSIS — R350 Frequency of micturition: Secondary | ICD-10-CM

## 2015-08-05 LAB — POC MICROSCOPIC URINALYSIS (UMFC): MUCUS RE: ABSENT

## 2015-08-05 LAB — POCT URINALYSIS DIP (MANUAL ENTRY)
Bilirubin, UA: NEGATIVE
Blood, UA: NEGATIVE
GLUCOSE UA: NEGATIVE
Ketones, POC UA: NEGATIVE
LEUKOCYTES UA: NEGATIVE
NITRITE UA: NEGATIVE
Protein Ur, POC: NEGATIVE
Spec Grav, UA: 1.01
UROBILINOGEN UA: 0.2
pH, UA: 5.5

## 2015-08-05 LAB — POCT WET + KOH PREP
Trich by wet prep: ABSENT
Yeast by KOH: ABSENT
Yeast by wet prep: ABSENT

## 2015-08-05 NOTE — Progress Notes (Signed)
[facility] [address] [place] [address] [phone]  Date:  08/05/2015   Name:  Yvonne Bryant   DOB:  Jun 05, 1955   MRN:  [id number]  PCP:  Criselda Peaches, MD    History of Present Illness:  Yvonne Bryant is a 60 y.o. female patient who presents to [facility] for urinary frequency.   1 week of urinary frequency, however had the urgency a couple weeks ago.  She has dysuria, and pressure of lower abdomen.  The sensation is mild.  No hematuria.  She has some back ache.  No fever or nausea.  No vaginal discharge changes.   Water intake is very minimal.  She has constipation for the last few weeks as well.     Patient Active Problem List   Diagnosis Date Noted  . Kidney stones 04/06/2011    Past Medical History:  Diagnosis Date  . Environmental allergies   . Kidney stones     No past surgical history on file.  Social History  Substance Use Topics  . Smoking status: Former Smoker    Packs/day: 1.00    Years: 20.00    Types: Cigarettes    Quit date: 04/06/2003  . Smokeless tobacco: Not on file  . Alcohol use No     Comment: rarely drinks    Family History  Problem Relation Age of Onset  . Uterine cancer    . Skin cancer      Allergies  Allergen Reactions  . Dairy Aid [Lactase]     Sick to stomach, bloated  . Iodine Other (See Comments)     And seafood,JUST FEELS SICK AND CANT FUNCTION  . Strawberry Extract     UNKNOWN  . Prednisone Palpitations    Medication list has been reviewed and updated.  Current Outpatient Prescriptions on File Prior to Visit  Medication Sig Dispense Refill  . aspirin EC 81 MG tablet Take 81 mg by mouth daily as needed (headache).     . naproxen sodium (ANAPROX) 220 MG tablet Take 220 mg by mouth 2 (two) times daily as needed (PAIN).    Marland Kitchen phenazopyridine (PYRIDIUM) 200 MG tablet Take 1 tablet (200 mg total) by mouth 3 (three) times daily as needed. (Patient not taking: Reported on 08/05/2015) 6 tablet 0  .  potassium citrate (UROCIT-K) 10 MEQ (1080 MG) SR tablet Take 10 mEq by mouth every morning.      No current facility-administered medications on file prior to visit.     ROS ROS otherwise unremarkable unless listed above.   Physical Examination: BP 110/74 (BP Location: Right Arm, Patient Position: Sitting, Cuff Size: Normal)   Pulse 78   Temp 98.6 F (37 C) (Oral)   Resp 16   Ht 5\' 4"  (1.626 m)   Wt 127 lb (57.6 kg)   SpO2 99%   BMI 21.80 kg/m  Ideal Body Weight: Weight in (lb) to have BMI = 25: 145.3  Physical Exam  Constitutional: She is oriented to person, place, and time. She appears well-developed and well-nourished. No distress.  HENT:  Head: Normocephalic and atraumatic.  Right Ear: External ear normal.  Left Ear: External ear normal.  Eyes: Conjunctivae and EOM are normal. Pupils are equal, round, and reactive to light.  Cardiovascular: Normal rate.   Pulmonary/Chest: Effort normal. No respiratory distress.  Abdominal: There is tenderness.  Lower right sided abdominal pain.   Neurological: She is alert and oriented to person, place, and  time.  Skin: She is not diaphoretic.  Psychiatric: She has a normal mood and affect. Her behavior is normal.   Results for orders placed or performed in visit on 08/05/15  POCT urinalysis dipstick  Result Value Ref Range   Color, UA yellow yellow   Clarity, UA clear clear   Glucose, UA negative negative   Bilirubin, UA negative negative   Ketones, POC UA negative negative   Spec Grav, UA 1.010    Blood, UA negative negative   pH, UA 5.5    Protein Ur, POC negative negative   Urobilinogen, UA 0.2    Nitrite, UA Negative Negative   Leukocytes, UA Negative Negative  POCT Microscopic Urinalysis ([facility])  Result Value Ref Range   WBC,UR,HPF,POC None None WBC/hpf   RBC,UR,HPF,POC None None RBC/hpf   Bacteria None None, Too numerous to count   Mucus Absent Absent   Epithelial Cells, UR Per Microscopy None None, Too numerous to  count cells/hpf  POCT Wet + KOH Prep  Result Value Ref Range   Yeast by KOH Absent Present, Absent   Yeast by wet prep Absent Present, Absent   WBC by wet prep Moderate (A) None, Few, Too numerous to count   Clue Cells Wet Prep HPF POC Few (A) None, Too numerous to count   Trich by wet prep Absent Present, Absent   Bacteria Wet Prep HPF POC Moderate (A) None, Few, Too numerous to count   Epithelial Cells By [employer] ([facility]) None None, Few, Too numerous to count   RBC,UR,HPF,POC None None RBC/hpf     Assessment and Plan: Yvonne Bryant is a 60 y.o. female who is here today for cc of urinary frequency. Possible uti, constipation, bv Placing urine culture.   Advised miralax daily, increase water intake.  Urinary frequency - Plan: POCT urinalysis dipstick, POCT Microscopic Urinalysis ([facility]), POCT Wet + KOH Prep, Urine culture  Lower abdominal pain - Plan: Urine culture   Ivar Drape, PA-C [facility] 08/05/2015 8:29 AM

## 2015-08-05 NOTE — Patient Instructions (Addendum)
     IF you received an x-ray today, you will receive an invoice from [facility]. Please contact [facility] at [phone] with questions or concerns regarding your invoice.   IF you received labwork today, you will receive an invoice from [employer]. Please contact [facility] at [phone] with questions or concerns regarding your invoice.   Our billing staff will not be able to assist you with questions regarding bills from these companies.  You will be contacted with the lab results as soon as they are available. The fastest way to get your results is to activate your My Chart account. Instructions are located on the last page of this paperwork. If you have not heard from [place] regarding the results in 2 weeks, please contact this office.    I will have the results of your urine culture shortly. I would like you to increase you water intake, and you can try miralax daily.  This is over the counter and will help move your bowels, if your symptoms are secondary to constipation.  Use 17g in 8 oz of water daily. You can use the ibuprofen for your pain as we discussed.

## 2015-08-06 LAB — URINE CULTURE: ORGANISM ID, BACTERIA: NO GROWTH

## 2015-08-11 ENCOUNTER — Telehealth: Payer: Self-pay | Admitting: Emergency Medicine

## 2015-08-11 NOTE — Telephone Encounter (Signed)
Pt given negative urine results States, she is feeling much better

## 2015-08-14 ENCOUNTER — Other Ambulatory Visit: Payer: Self-pay | Admitting: Internal Medicine

## 2015-08-14 DIAGNOSIS — Z1231 Encounter for screening mammogram for malignant neoplasm of breast: Secondary | ICD-10-CM

## 2015-08-21 ENCOUNTER — Encounter: Payer: Self-pay | Admitting: Gastroenterology

## 2015-08-31 ENCOUNTER — Encounter: Payer: Self-pay | Admitting: Gastroenterology

## 2015-09-25 ENCOUNTER — Ambulatory Visit: Payer: BLUE CROSS/BLUE SHIELD

## 2015-09-29 ENCOUNTER — Ambulatory Visit
Admission: RE | Admit: 2015-09-29 | Discharge: 2015-09-29 | Disposition: A | Discharge status: Home / Self Care | Payer: BLUE CROSS/BLUE SHIELD | Source: Ambulatory Visit | Attending: Internal Medicine | Admitting: Internal Medicine

## 2015-09-29 DIAGNOSIS — Z1231 Encounter for screening mammogram for malignant neoplasm of breast: Secondary | ICD-10-CM | POA: Diagnosis not present

## 2015-10-02 ENCOUNTER — Other Ambulatory Visit: Payer: Self-pay | Admitting: Internal Medicine

## 2015-10-02 DIAGNOSIS — R928 Other abnormal and inconclusive findings on diagnostic imaging of breast: Secondary | ICD-10-CM

## 2015-10-06 ENCOUNTER — Ambulatory Visit
Admission: RE | Admit: 2015-10-06 | Discharge: 2015-10-06 | Disposition: A | Discharge status: Home / Self Care | Payer: BLUE CROSS/BLUE SHIELD | Source: Ambulatory Visit | Attending: Internal Medicine | Admitting: Internal Medicine

## 2015-10-06 DIAGNOSIS — R928 Other abnormal and inconclusive findings on diagnostic imaging of breast: Secondary | ICD-10-CM

## 2016-03-01 DIAGNOSIS — H43811 Vitreous degeneration, right eye: Secondary | ICD-10-CM | POA: Diagnosis not present

## 2016-03-01 DIAGNOSIS — H2513 Age-related nuclear cataract, bilateral: Secondary | ICD-10-CM | POA: Diagnosis not present

## 2016-04-26 DIAGNOSIS — H04123 Dry eye syndrome of bilateral lacrimal glands: Secondary | ICD-10-CM | POA: Diagnosis not present

## 2016-04-26 DIAGNOSIS — H2513 Age-related nuclear cataract, bilateral: Secondary | ICD-10-CM | POA: Diagnosis not present

## 2016-05-03 ENCOUNTER — Ambulatory Visit (INDEPENDENT_AMBULATORY_CARE_PROVIDER_SITE_OTHER): Payer: BLUE CROSS/BLUE SHIELD | Admitting: Family Medicine

## 2016-05-03 VITALS — BP 132/73 | HR 89 | Temp 98.3°F | Resp 20 | Ht 63.74 in | Wt 127.4 lb

## 2016-05-03 DIAGNOSIS — R300 Dysuria: Secondary | ICD-10-CM

## 2016-05-03 DIAGNOSIS — R350 Frequency of micturition: Secondary | ICD-10-CM | POA: Diagnosis not present

## 2016-05-03 DIAGNOSIS — R319 Hematuria, unspecified: Secondary | ICD-10-CM

## 2016-05-03 DIAGNOSIS — N390 Urinary tract infection, site not specified: Secondary | ICD-10-CM

## 2016-05-03 DIAGNOSIS — R509 Fever, unspecified: Secondary | ICD-10-CM

## 2016-05-03 LAB — POCT URINALYSIS DIP (MANUAL ENTRY)
Bilirubin, UA: NEGATIVE
GLUCOSE UA: NEGATIVE mg/dL
Ketones, POC UA: NEGATIVE mg/dL
NITRITE UA: POSITIVE — AB
Protein Ur, POC: NEGATIVE mg/dL
Spec Grav, UA: <=1.005 — AB (ref 1.010–1.025)
UROBILINOGEN UA: 0.2 U/dL
pH, UA: 5 (ref 5.0–8.0)

## 2016-05-03 LAB — POC MICROSCOPIC URINALYSIS (UMFC): MUCUS RE: ABSENT

## 2016-05-03 MED ORDER — CIPROFLOXACIN HCL 500 MG PO TABS: 500.0000 mg | ORAL_TABLET | Freq: Two times a day (BID) | ORAL | 0 refills | Status: DC

## 2016-05-03 NOTE — Progress Notes (Signed)
Subjective:  This chart was scribed for Merri Ray MD, by Tamsen Roers, at [place] at [facility].  This patient was seen in room 7 and the patient's care was started at 11:05 AM.   Chief Complaint  Patient presents with  . Urinary Frequency    X 10 days  . Dysuria    X 10 days  . Back Pain    X 10 days lower back     Patient ID: Yvonne Bryant, female    DOB: 04-Oct-1955, 61 y.o.   MRN: [id number]  HPI HPI Comments: Yvonne Bryant is a 61 y.o. female who presents to Primary Care at [facility] for a possible urinary tract infection with multiple symptoms including urinary frequency, dysuria and lower abdominal and back pain onset 10 days ago.  On her view of chart she was treated for UTI in July 2017 but culture was negative at that time. Previous urine culture august 2016- e.coli. Her symptoms first started with frequency and had some discomfort at the end of urination.  She states it is worse at night time.  Fever this morning (first time) read 100.3 and felt some chills. She has been taking Advil and Monistat (which seemed to calm it down), but didn't change the frequency. Patient states that she has had similar symptoms in the past.     Patient Active Problem List   Diagnosis Date Noted  . Kidney stones 04/06/2011   Past Medical History:  Diagnosis Date  . Environmental allergies   . Kidney stones    History reviewed. No pertinent surgical history. Allergies  Allergen Reactions  . Dairy Aid [Lactase]     Sick to stomach, bloated  . Iodine Other (See Comments)     And seafood,JUST FEELS SICK AND CANT FUNCTION  . Strawberry Extract     UNKNOWN  . Prednisone Palpitations   Prior to Admission medications   Medication Sig Start Date End Date Taking? Authorizing Provider  aspirin EC 81 MG tablet Take 81 mg by mouth daily as needed (headache).    Yes Historical Provider, MD  naproxen sodium (ANAPROX) 220 MG tablet Take 220 mg by mouth 2 (two) times daily as needed  (PAIN).    Historical Provider, MD  phenazopyridine (PYRIDIUM) 200 MG tablet Take 1 tablet (200 mg total) by mouth 3 (three) times daily as needed. Patient not taking: Reported on 08/05/2015 08/11/14   Roselee Culver, MD  potassium citrate (UROCIT-K) 10 MEQ (1080 MG) SR tablet Take 10 mEq by mouth every morning.     Historical Provider, MD   Social History   Social History  . Marital status: Married    Spouse name: N/A  . Number of children: N/A  . Years of education: N/A   Occupational History  . Not on file.   Social History Main Topics  . Smoking status: Former Smoker    Packs/day: 1.00    Years: 20.00    Types: Cigarettes    Quit date: 04/06/2003  . Smokeless tobacco: Never Used  . Alcohol use No     Comment: rarely drinks  . Drug use: No  . Sexual activity: Yes     Comment: 1 partner in last 12 months   Other Topics Concern  . Not on file   Social History Narrative  . No narrative on file      Review of Systems  Constitutional: Positive for chills and fever.  Eyes: Negative for pain, redness and itching.  Gastrointestinal: Negative for nausea and vomiting.  Genitourinary: Positive for dysuria and frequency.  Musculoskeletal: Positive for back pain. Negative for neck pain and neck stiffness.  Skin: Negative for color change.  Neurological: Negative for syncope and speech difficulty.       Objective:   Physical Exam  Cardiovascular: Normal rate, regular rhythm and normal heart sounds.  Exam reveals no gallop and no friction rub.   No murmur heard. Pulmonary/Chest: Effort normal and breath sounds normal. No respiratory distress. She has no wheezes. She has no rales.  Abdominal:  No CVA tenderness.  minimal suprapubic discomfort, no rebound no guarding while laying flat.    Vitals:   05/03/16 1059  BP: 132/73  Pulse: 89  Resp: 20  Temp: 98.3 F (36.8 C)  TempSrc: Oral  SpO2: 99%  Weight: 127 lb 6.4 oz (57.8 kg)  Height: 5' 3.74" (1.619 m)    Results for orders placed or performed in visit on 05/03/16  POCT urinalysis dipstick  Result Value Ref Range   Color, UA yellow yellow   Clarity, UA cloudy (A) clear   Glucose, UA negative negative mg/dL   Bilirubin, UA negative negative   Ketones, POC UA negative negative mg/dL   Spec Grav, UA <=1.005 (A) 1.010 - 1.025   Blood, UA small (A) negative   pH, UA 5.0 5.0 - 8.0   Protein Ur, POC negative negative mg/dL   Urobilinogen, UA 0.2 0.2 or 1.0 E.U./dL   Nitrite, UA Positive (A) Negative   Leukocytes, UA Large (3+) (A) Negative  POCT Microscopic Urinalysis ([facility])  Result Value Ref Range   WBC,UR,HPF,POC Too numerous to count  (A) None WBC/hpf   RBC,UR,HPF,POC Few (A) None RBC/hpf   Bacteria Many (A) None, Too numerous to count   Mucus Absent Absent   Epithelial Cells, UR Per Microscopy Few (A) None, Too numerous to count cells/hpf       Assessment & Plan:    Yvonne Bryant is a 61 y.o. female Urinary frequency - Plan: POCT urinalysis dipstick, POCT Microscopic Urinalysis ([facility]), Urine culture, ciprofloxacin (CIPRO) 500 MG tablet  Dysuria - Plan: POCT urinalysis dipstick, POCT Microscopic Urinalysis ([facility]), Urine culture, ciprofloxacin (CIPRO) 500 MG tablet  Urinary tract infection with hematuria, site unspecified - Plan: ciprofloxacin (CIPRO) 500 MG tablet  Fever, unspecified - Plan: ciprofloxacin (CIPRO) 500 MG tablet  10 day hx of urinary symptoms, UTI as above, now with reported fever at home., but no CVAT, N/V  - start Cipro, possible side effects and tendinopathy risks discussed.   -check urine culture, handout given, RTC precautions given.   Meds ordered this encounter  Medications  . ciprofloxacin (CIPRO) 500 MG tablet    Sig: Take 1 tablet (500 mg total) by mouth 2 (two) times daily.    Dispense:  20 tablet    Refill:  0   Patient Instructions    Start antibiotic Cipro, one pill twice per day. Continue to drink plenty of fluids. Tylenol as needed  for body aches or fever. Return to the clinic or go to the nearest emergency room if any of your symptoms worsen or new symptoms occur.   Urinary Tract Infection, Adult A urinary tract infection (UTI) is an infection of any part of the urinary tract, which includes the kidneys, ureters, bladder, and urethra. These organs make, store, and get rid of urine in the body. UTI can be a bladder infection (cystitis) or kidney infection (pyelonephritis). What are the causes? This infection may  be caused by fungi, viruses, or bacteria. Bacteria are the most common cause of UTIs. This condition can also be caused by repeated incomplete emptying of the bladder during urination. What increases the risk? This condition is more likely to develop if:  You ignore your need to urinate or hold urine for long periods of time.  You do not empty your bladder completely during urination.  You wipe back to front after urinating or having a bowel movement, if you are female.  You are uncircumcised, if you are female.  You are constipated.  You have a urinary catheter that stays in place (indwelling).  You have a weak defense (immune) system.  You have a medical condition that affects your bowels, kidneys, or bladder.  You have diabetes.  You take antibiotic medicines frequently or for long periods of time, and the antibiotics no longer work well against certain types of infections (antibiotic resistance).  You take medicines that irritate your urinary tract.  You are exposed to chemicals that irritate your urinary tract.  You are female. What are the signs or symptoms? Symptoms of this condition include:  Fever.  Frequent urination or passing small amounts of urine frequently.  Needing to urinate urgently.  Pain or burning with urination.  Urine that smells bad or unusual.  Cloudy urine.  Pain in the lower abdomen or back.  Trouble urinating.  Blood in the urine.  Vomiting or being less  hungry than normal.  Diarrhea or abdominal pain.  Vaginal discharge, if you are female. How is this diagnosed? This condition is diagnosed with a medical history and physical exam. You will also need to provide a urine sample to test your urine. Other tests may be done, including:  Blood tests.  Sexually transmitted disease (STD) testing. If you have had more than one UTI, a cystoscopy or imaging studies may be done to determine the cause of the infections. How is this treated? Treatment for this condition often includes a combination of two or more of the following:  Antibiotic medicine.  Other medicines to treat less common causes of UTI.  Over-the-counter medicines to treat pain.  Drinking enough water to stay hydrated. Follow these instructions at home:  Take over-the-counter and prescription medicines only as told by your health care provider.  If you were prescribed an antibiotic, take it as told by your health care provider. Do not stop taking the antibiotic even if you start to feel better.  Avoid alcohol, caffeine, tea, and carbonated beverages. They can irritate your bladder.  Drink enough fluid to keep your urine clear or pale yellow.  Keep all follow-up visits as told by your health care provider. This is important.  Make sure to:  Empty your bladder often and completely. Do not hold urine for long periods of time.  Empty your bladder before and after sex.  Wipe from front to back after a bowel movement if you are female. Use each tissue one time when you wipe. Contact a health care provider if:  You have back pain.  You have a fever.  You feel nauseous or vomit.  Your symptoms do not get better after 3 days.  Your symptoms go away and then return. Get help right away if:  You have severe back pain or lower abdominal pain.  You are vomiting and cannot keep down any medicines or water. This information is not intended to replace advice given to you  by your health care provider. Make sure you discuss  any questions you have with your health care provider. Document Released: 10/06/2004 Document Revised: 06/10/2015 Document Reviewed: 11/17/2014 Elsevier Interactive Patient Education  2017 [employer].    IF you received an x-ray today, you will receive an invoice from [facility]. Please contact [facility] at [phone] with questions or concerns regarding your invoice.   IF you received labwork today, you will receive an invoice from [employer]. Please contact [facility] at [phone] with questions or concerns regarding your invoice.   Our billing staff will not be able to assist you with questions regarding bills from these companies.  You will be contacted with the lab results as soon as they are available. The fastest way to get your results is to activate your My Chart account. Instructions are located on the last page of this paperwork. If you have not heard from [place] regarding the results in 2 weeks, please contact this office.       I personally performed the services described in this documentation, which was scribed in my presence. The recorded information has been reviewed and considered for accuracy and completeness, addended by me as needed, and agree with information above.  Signed,   Merri Ray, MD [facility] [place].  05/03/16 1:24 PM

## 2016-05-03 NOTE — Patient Instructions (Addendum)
Start antibiotic Cipro, one pill twice per day. Continue to drink plenty of fluids. Tylenol as needed for body aches or fever. Return to the clinic or go to the nearest emergency room if any of your symptoms worsen or new symptoms occur.   Urinary Tract Infection, Adult A urinary tract infection (UTI) is an infection of any part of the urinary tract, which includes the kidneys, ureters, bladder, and urethra. These organs make, store, and get rid of urine in the body. UTI can be a bladder infection (cystitis) or kidney infection (pyelonephritis). What are the causes? This infection may be caused by fungi, viruses, or bacteria. Bacteria are the most common cause of UTIs. This condition can also be caused by repeated incomplete emptying of the bladder during urination. What increases the risk? This condition is more likely to develop if:  You ignore your need to urinate or hold urine for long periods of time.  You do not empty your bladder completely during urination.  You wipe back to front after urinating or having a bowel movement, if you are female.  You are uncircumcised, if you are female.  You are constipated.  You have a urinary catheter that stays in place (indwelling).  You have a weak defense (immune) system.  You have a medical condition that affects your bowels, kidneys, or bladder.  You have diabetes.  You take antibiotic medicines frequently or for long periods of time, and the antibiotics no longer work well against certain types of infections (antibiotic resistance).  You take medicines that irritate your urinary tract.  You are exposed to chemicals that irritate your urinary tract.  You are female. What are the signs or symptoms? Symptoms of this condition include:  Fever.  Frequent urination or passing small amounts of urine frequently.  Needing to urinate urgently.  Pain or burning with urination.  Urine that smells bad or unusual.  Cloudy  urine.  Pain in the lower abdomen or back.  Trouble urinating.  Blood in the urine.  Vomiting or being less hungry than normal.  Diarrhea or abdominal pain.  Vaginal discharge, if you are female. How is this diagnosed? This condition is diagnosed with a medical history and physical exam. You will also need to provide a urine sample to test your urine. Other tests may be done, including:  Blood tests.  Sexually transmitted disease (STD) testing. If you have had more than one UTI, a cystoscopy or imaging studies may be done to determine the cause of the infections. How is this treated? Treatment for this condition often includes a combination of two or more of the following:  Antibiotic medicine.  Other medicines to treat less common causes of UTI.  Over-the-counter medicines to treat pain.  Drinking enough water to stay hydrated. Follow these instructions at home:  Take over-the-counter and prescription medicines only as told by your health care provider.  If you were prescribed an antibiotic, take it as told by your health care provider. Do not stop taking the antibiotic even if you start to feel better.  Avoid alcohol, caffeine, tea, and carbonated beverages. They can irritate your bladder.  Drink enough fluid to keep your urine clear or pale yellow.  Keep all follow-up visits as told by your health care provider. This is important.  Make sure to:  Empty your bladder often and completely. Do not hold urine for long periods of time.  Empty your bladder before and after sex.  Wipe from front to back after a  bowel movement if you are female. Use each tissue one time when you wipe. Contact a health care provider if:  You have back pain.  You have a fever.  You feel nauseous or vomit.  Your symptoms do not get better after 3 days.  Your symptoms go away and then return. Get help right away if:  You have severe back pain or lower abdominal pain.  You are  vomiting and cannot keep down any medicines or water. This information is not intended to replace advice given to you by your health care provider. Make sure you discuss any questions you have with your health care provider. Document Released: 10/06/2004 Document Revised: 06/10/2015 Document Reviewed: 11/17/2014 Elsevier Interactive Patient Education  2017 [employer].    IF you received an x-ray today, you will receive an invoice from [facility]. Please contact [facility] at [phone] with questions or concerns regarding your invoice.   IF you received labwork today, you will receive an invoice from [facility]. Please contact [facility] at [phone] with questions or concerns regarding your invoice.   Our billing staff will not be able to assist you with questions regarding bills from these companies.  You will be contacted with the lab results as soon as they are available. The fastest way to get your results is to activate your My Chart account. Instructions are located on the last page of this paperwork. If you have not heard from [place] regarding the results in 2 weeks, please contact this office.

## 2016-05-05 ENCOUNTER — Other Ambulatory Visit: Payer: Self-pay | Admitting: Family Medicine

## 2016-05-05 LAB — URINE CULTURE

## 2016-05-05 MED ORDER — SULFAMETHOXAZOLE-TRIMETHOPRIM 800-160 MG PO TABS: 1.0000 | ORAL_TABLET | Freq: Two times a day (BID) | ORAL | 0 refills | Status: DC

## 2016-05-05 NOTE — Progress Notes (Signed)
See lab results. Resistant to Cipro. Start septra.

## 2016-07-05 ENCOUNTER — Encounter: Payer: Self-pay | Admitting: Gastroenterology

## 2016-08-03 IMAGING — MG MM DIGITAL SCREENING BILAT W/ CAD
5 series · 5 of 5 positions shown · non-contrast
Comparison: Previous exam(s).

CLINICAL DATA: Screening.

EXAM:
DIGITAL SCREENING BILATERAL MAMMOGRAM WITH CAD

[R CC]
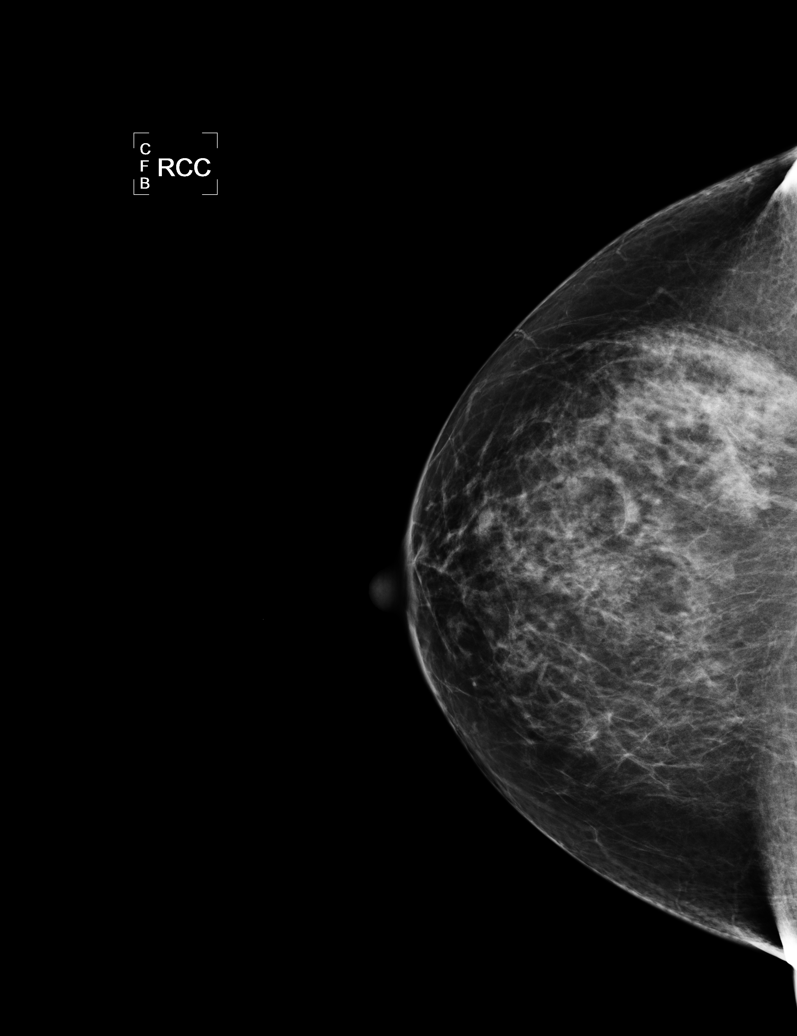

[L CC]
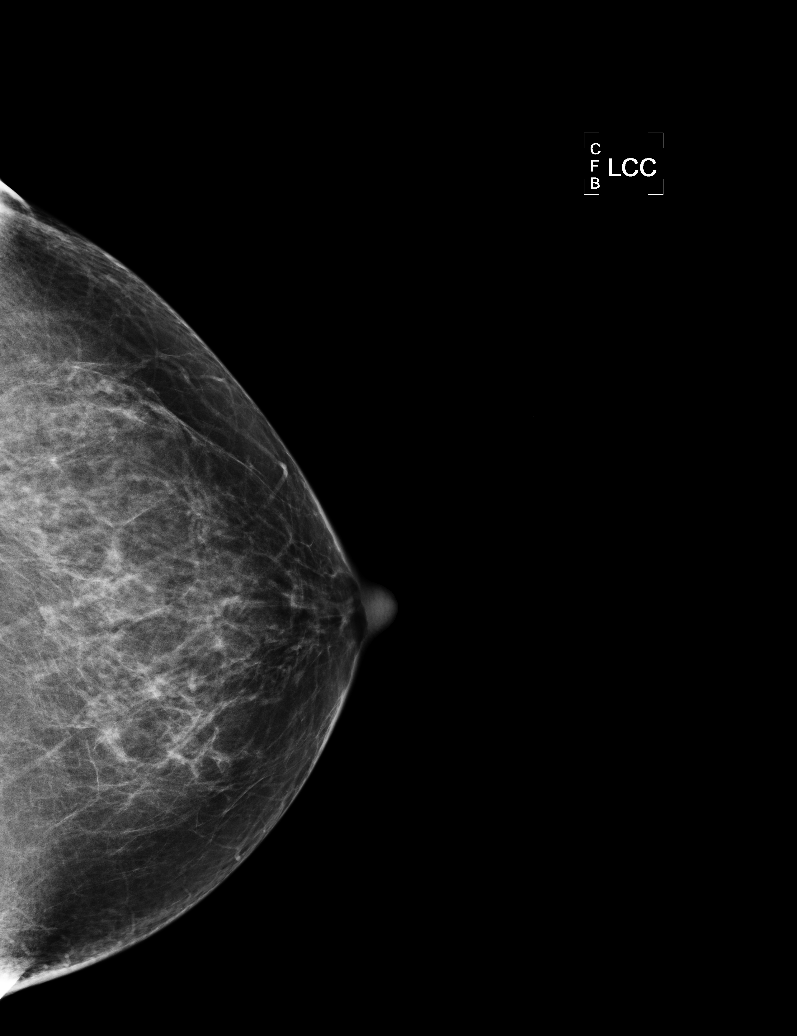

[L MLO (1 of 2)]
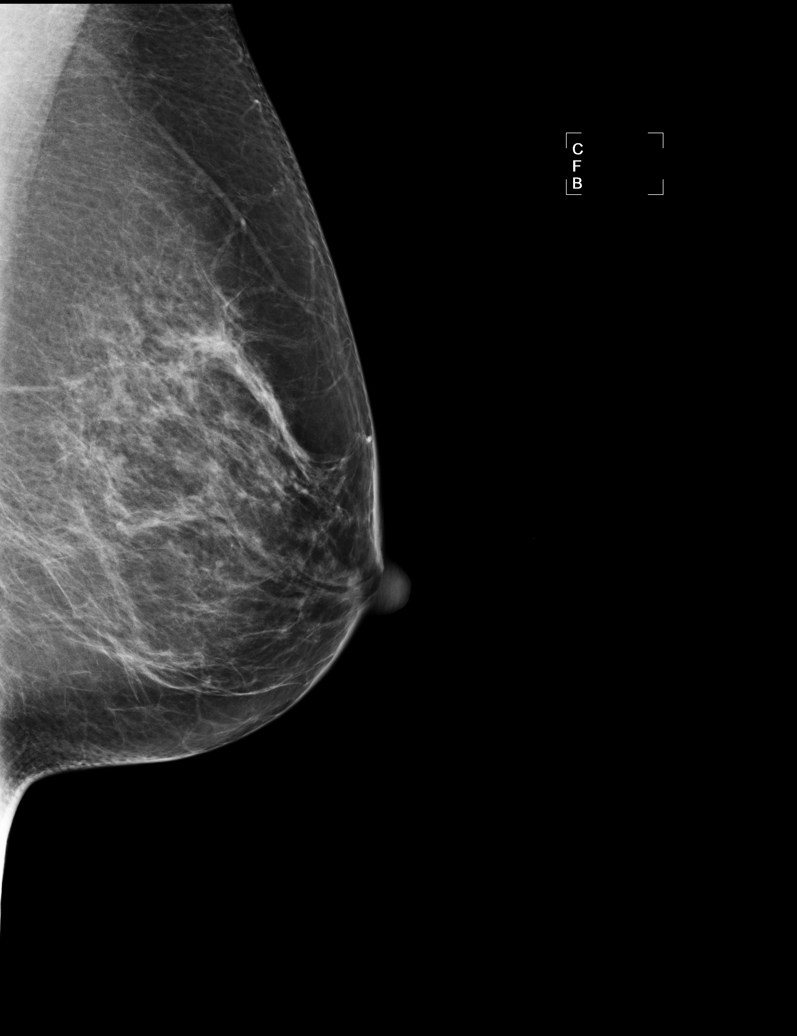

[R MLO]
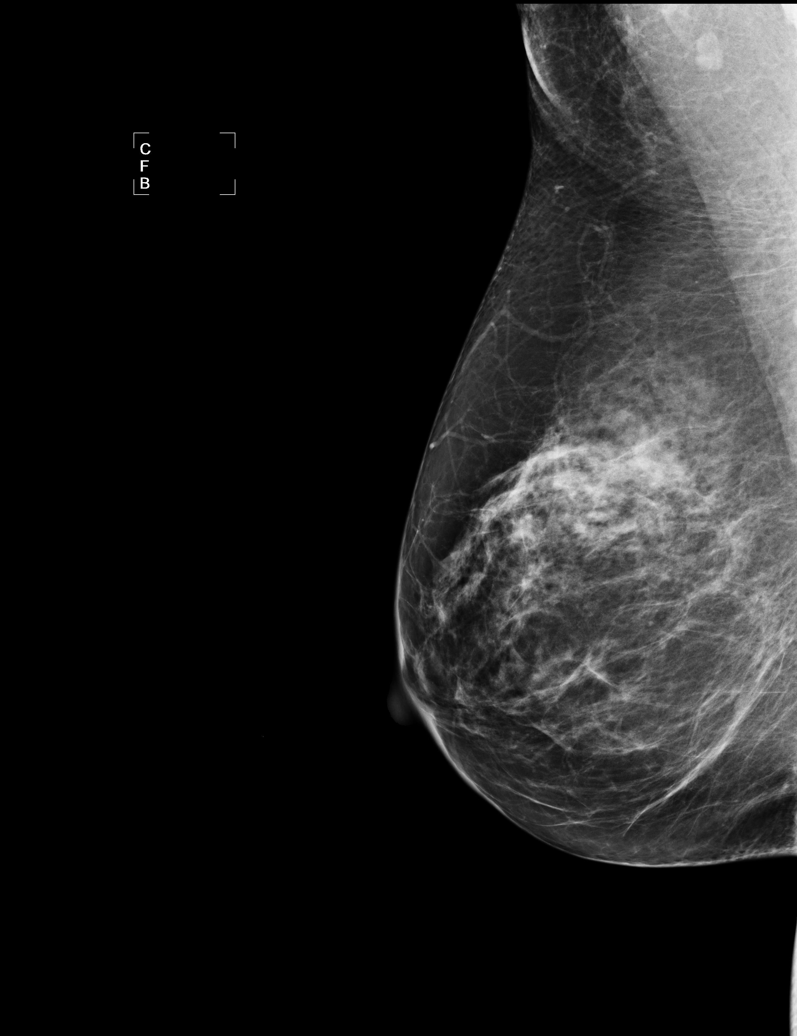

[L MLO (2 of 2)]
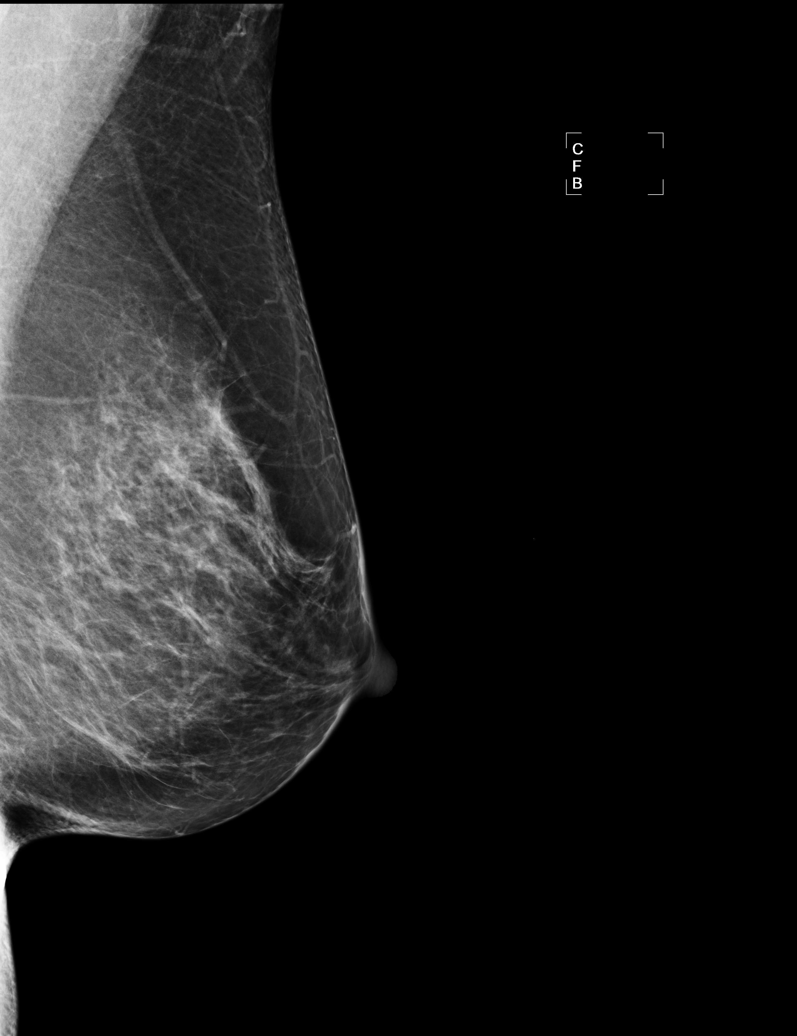

[5 of 5 positions shown; findings below may reference images not displayed]

ACR Breast Density Category b: There are scattered areas of
fibroglandular density.
FINDINGS: There are no findings suspicious for malignancy. Images were
processed with CAD.
IMPRESSION: No mammographic evidence of malignancy. A result letter of this
screening mammogram will be mailed directly to the patient.

RECOMMENDATION:
Screening mammogram in one year. (Code:AS-G-LCT)

BI-RADS CATEGORY  1: Negative.

## 2016-08-26 ENCOUNTER — Ambulatory Visit (AMBULATORY_SURGERY_CENTER): Payer: Self-pay

## 2016-08-26 VITALS — Ht 66.0 in | Wt 128.6 lb

## 2016-08-26 DIAGNOSIS — Z1211 Encounter for screening for malignant neoplasm of colon: Secondary | ICD-10-CM

## 2016-08-26 MED ORDER — SUPREP BOWEL PREP KIT 17.5-3.13-1.6 GM/177ML PO SOLN: 1.0000 | Freq: Once | ORAL | 0 refills | Status: AC

## 2016-08-26 NOTE — Progress Notes (Signed)
No allergies to eggs or soy No diet meds No home oxygen No past problems with anesthesia except sensitive to Ether

## 2016-08-29 ENCOUNTER — Encounter: Payer: Self-pay | Admitting: Gastroenterology

## 2016-09-09 ENCOUNTER — Ambulatory Visit (AMBULATORY_SURGERY_CENTER): Payer: BLUE CROSS/BLUE SHIELD | Admitting: Gastroenterology

## 2016-09-09 ENCOUNTER — Encounter: Payer: Self-pay | Admitting: Gastroenterology

## 2016-09-09 VITALS — BP 127/51 | HR 70 | Temp 97.3°F | Resp 18 | Ht 66.0 in | Wt 128.0 lb

## 2016-09-09 DIAGNOSIS — K635 Polyp of colon: Secondary | ICD-10-CM | POA: Diagnosis not present

## 2016-09-09 DIAGNOSIS — Z1211 Encounter for screening for malignant neoplasm of colon: Secondary | ICD-10-CM

## 2016-09-09 DIAGNOSIS — D125 Benign neoplasm of sigmoid colon: Secondary | ICD-10-CM

## 2016-09-09 MED ORDER — SODIUM CHLORIDE 0.9 % IV SOLN: 500.0000 mL | INTRAVENOUS | Status: DC

## 2016-09-09 NOTE — Patient Instructions (Signed)
  Try taking benefiber (soluble fiber)  1 teaspoonful three times per day.  Avoid NSAIDS (Motrin,Advil, Aleve, Ibuprofen,Naparosyn etc) for two weeks, until  September 14,2018.   YOU HAD AN ENDOSCOPIC PROCEDURE TODAY AT [facility]:   Refer to the procedure report that was given to you for any specific questions about what was found during the examination.  If the procedure report does not answer your questions, please call your gastroenterologist to clarify.  If you requested that your care partner not be given the details of your procedure findings, then the procedure report has been included in a sealed envelope for you to review at your convenience later.  YOU SHOULD EXPECT: Some feelings of bloating in the abdomen. Passage of more gas than usual.  Walking can help get rid of the air that was put into your GI tract during the procedure and reduce the bloating. If you had a lower endoscopy (such as a colonoscopy or flexible sigmoidoscopy) you may notice spotting of blood in your stool or on the toilet paper. If you underwent a bowel prep for your procedure, you may not have a normal bowel movement for a few days.  Please Note:  You might notice some irritation and congestion in your nose or some drainage.  This is from the oxygen used during your procedure.  There is no need for concern and it should clear up in a day or so.  SYMPTOMS TO REPORT IMMEDIATELY:   Following lower endoscopy (colonoscopy or flexible sigmoidoscopy):  Excessive amounts of blood in the stool  Significant tenderness or worsening of abdominal pains  Swelling of the abdomen that is new, acute  Fever of 100F or higher   For urgent or emergent issues, a gastroenterologist can be reached at any hour by calling [phone].   DIET:  We do recommend a small meal at first, but then you may proceed to your regular diet.  Drink plenty of fluids but you should avoid alcoholic beverages for 24  hours.  ACTIVITY:  You should plan to take it easy for the rest of today and you should NOT DRIVE or use heavy machinery until tomorrow (because of the sedation medicines used during the test).    FOLLOW UP: Our staff will call the number listed on your records the next business day following your procedure to check on you and address any questions or concerns that you may have regarding the information given to you following your procedure. If we do not reach you, we will leave a message.  However, if you are feeling well and you are not experiencing any problems, there is no need to return our call.  We will assume that you have returned to your regular daily activities without incident.  If any biopsies were taken you will be contacted by phone or by letter within the next 1-3 weeks.  Please call us at [phone] if you have not heard about the biopsies in 3 weeks.    SIGNATURES/CONFIDENTIALITY: You and/or your care partner have signed paperwork which will be entered into your electronic medical record.  These signatures attest to the fact that that the information above on your After Visit Summary has been reviewed and is understood.  Full responsibility of the confidentiality of this discharge information lies with you and/or your care-partner.

## 2016-09-09 NOTE — Progress Notes (Signed)
Pt's states no medical or surgical changes since previsit or office visit. 

## 2016-09-09 NOTE — Progress Notes (Signed)
Patient after dressing stating she is having abdominal discomfort, 4-5 /10. Abdominal pain. Patient walked to the restroom, passed liquid stool. Now 4/10. Coffee given.

## 2016-09-09 NOTE — Progress Notes (Signed)
Patient seen by Dr. Silverio Decamp  And ok for discharge.

## 2016-09-09 NOTE — Progress Notes (Signed)
Patient drank one cup of coffee, walked about the unit, to restroom, flatus passed. patient stating it comes and goes. When pain is present 4/10, without 0/10.informed Dr Silverio Decamp, simethicone 3 ml given and levsin 2 tablets SL

## 2016-09-09 NOTE — Op Note (Signed)
[facility] Patient Name: Yvonne Bryant Procedure Date: 09/09/2016 12:49 PM MRN: [id number] Endoscopist: Mauri Pole , MD Age: 61 Referring MD:  Date of Birth: 1955/02/25 Gender: Female Account #: [id number] Procedure:                Colonoscopy Indications:              Screening for colorectal malignant neoplasm, Last                            colonoscopy: 2007 Medicines:                Monitored Anesthesia Care Procedure:                Pre-Anesthesia Assessment:                           - Prior to the procedure, a History and Physical                            was performed, and patient medications and                            allergies were reviewed. The patient's tolerance of                            previous anesthesia was also reviewed. The risks                            and benefits of the procedure and the sedation                            options and risks were discussed with the patient.                            All questions were answered, and informed consent                            was obtained. Prior Anticoagulants: The patient has                            taken no previous anticoagulant or antiplatelet                            agents. ASA Grade Assessment: I - A normal, healthy                            patient. After reviewing the risks and benefits,                            the patient was deemed in satisfactory condition to                            undergo the procedure.  After obtaining informed consent, the colonoscope                            was passed under direct vision. Throughout the                            procedure, the patient's blood pressure, pulse, and                            oxygen saturations were monitored continuously. The                            Colonoscope was introduced through the anus and                            advanced to the the cecum, identified by                    appendiceal orifice and ileocecal valve. The                            colonoscopy was performed without difficulty. The                            patient tolerated the procedure well. The quality                            of the bowel preparation was excellent. The                            ileocecal valve, appendiceal orifice, and rectum                            were photographed. Scope In: 12:58:24 PM Scope Out: 1:15:27 PM Scope Withdrawal Time: 0 hours 10 minutes 41 seconds  Total Procedure Duration: 0 hours 17 minutes 3 seconds  Findings:                 The perianal and digital rectal examinations were                            normal.                           A 7 mm polyp was found in the sigmoid colon. The                            polyp was sessile. The polyp was removed with a                            cold snare. Resection and retrieval were complete.                            Coagulation for hemostasis post polypectomy using  monopolar probe was successful. Estimated blood                            loss was minimal.                           Non-bleeding internal hemorrhoids were found during                            retroflexion. The hemorrhoids were small.                           The exam was otherwise without abnormality. Complications:            No immediate complications. Estimated Blood Loss:     Estimated blood loss was minimal. Impression:               - One 7 mm polyp in the sigmoid colon, removed with                            a cold snare. Resected and retrieved. Treated with                            a monopolar probe.                           - Non-bleeding internal hemorrhoids.                           - The examination was otherwise normal. Recommendation:           - Patient has a contact number available for                            emergencies. The signs and symptoms of potential                             delayed complications were discussed with the                            patient. Return to normal activities tomorrow.                            Written discharge instructions were provided to the                            patient.                           - Resume previous diet.                           - Continue present medications.                           - No aspirin, ibuprofen, naproxen, or other  non-steroidal anti-inflammatory drugs for 2 weeks.                           - Await pathology results.                           - Repeat colonoscopy in 5-10 years for surveillance                            based on pathology results. Mauri Pole, MD 09/09/2016 1:20:35 PM This report has been signed electronically.

## 2016-09-09 NOTE — Progress Notes (Signed)
Called to room to assist during endoscopic procedure.  Patient ID and intended procedure confirmed with present staff. Received instructions for my participation in the procedure from the performing physician.  

## 2016-09-09 NOTE — Progress Notes (Signed)
Report to PACU, RN, vss, BBS= Clear.  

## 2016-09-13 ENCOUNTER — Telehealth: Payer: Self-pay | Admitting: *Deleted

## 2016-09-13 NOTE — Telephone Encounter (Signed)
  Follow up Call-  Call back number 09/09/2016  Post procedure Call Back phone  # [phone]  Permission to leave phone message Yes  Some recent data might be hidden     Patient questions:  Do you have a fever, pain , or abdominal swelling? No. Pain Score  0 *  Have you tolerated food without any problems? Yes.    Have you been able to return to your normal activities? Yes.    Do you have any questions about your discharge instructions: Diet   No. Medications  No. Follow up visit  No.  Do you have questions or concerns about your Care? No.  Actions: * If pain score is 4 or above: No action needed, pain <4.

## 2016-09-16 ENCOUNTER — Encounter: Payer: Self-pay | Admitting: Gastroenterology

## 2016-09-28 ENCOUNTER — Other Ambulatory Visit: Payer: Self-pay | Admitting: Internal Medicine

## 2016-09-28 DIAGNOSIS — Z1231 Encounter for screening mammogram for malignant neoplasm of breast: Secondary | ICD-10-CM

## 2016-10-10 ENCOUNTER — Emergency Department (HOSPITAL_COMMUNITY)
Admission: EM | Admit: 2016-10-10 | Discharge: 2016-10-10 | Discharge status: Against Medical Advice | Payer: BLUE CROSS/BLUE SHIELD
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
## 2016-10-11 DIAGNOSIS — E785 Hyperlipidemia, unspecified: Secondary | ICD-10-CM | POA: Diagnosis not present

## 2016-10-11 DIAGNOSIS — Z0000 Encounter for general adult medical examination without abnormal findings: Secondary | ICD-10-CM | POA: Diagnosis not present

## 2016-10-11 DIAGNOSIS — Z6841 Body Mass Index (BMI) 40.0 and over, adult: Secondary | ICD-10-CM | POA: Diagnosis not present

## 2016-10-11 DIAGNOSIS — N200 Calculus of kidney: Secondary | ICD-10-CM | POA: Diagnosis not present

## 2016-10-12 ENCOUNTER — Ambulatory Visit
Admission: RE | Admit: 2016-10-12 | Discharge: 2016-10-12 | Disposition: A | Discharge status: Home / Self Care | Payer: BLUE CROSS/BLUE SHIELD | Source: Ambulatory Visit | Attending: Internal Medicine | Admitting: Internal Medicine

## 2016-10-12 DIAGNOSIS — Z1231 Encounter for screening mammogram for malignant neoplasm of breast: Secondary | ICD-10-CM

## 2016-10-31 DIAGNOSIS — Z23 Encounter for immunization: Secondary | ICD-10-CM | POA: Diagnosis not present

## 2017-02-14 DIAGNOSIS — K210 Gastro-esophageal reflux disease with esophagitis: Secondary | ICD-10-CM | POA: Diagnosis not present

## 2017-02-14 DIAGNOSIS — R100 Acute abdomen: Secondary | ICD-10-CM | POA: Diagnosis not present

## 2017-02-14 DIAGNOSIS — R1111 Vomiting without nausea: Secondary | ICD-10-CM | POA: Diagnosis not present

## 2017-03-07 DIAGNOSIS — R109 Unspecified abdominal pain: Secondary | ICD-10-CM | POA: Diagnosis not present

## 2017-04-18 DIAGNOSIS — R252 Cramp and spasm: Secondary | ICD-10-CM | POA: Diagnosis not present

## 2017-09-05 ENCOUNTER — Other Ambulatory Visit: Payer: Self-pay | Admitting: Internal Medicine

## 2017-09-05 DIAGNOSIS — Z1231 Encounter for screening mammogram for malignant neoplasm of breast: Secondary | ICD-10-CM

## 2017-10-16 ENCOUNTER — Other Ambulatory Visit (HOSPITAL_COMMUNITY)
Admission: RE | Admit: 2017-10-16 | Discharge: 2017-10-16 | Disposition: A | Discharge status: Home / Self Care | Payer: BLUE CROSS/BLUE SHIELD | Source: Ambulatory Visit | Attending: Internal Medicine | Admitting: Internal Medicine

## 2017-10-16 ENCOUNTER — Ambulatory Visit
Admission: RE | Admit: 2017-10-16 | Discharge: 2017-10-16 | Disposition: A | Discharge status: Home / Self Care | Payer: BLUE CROSS/BLUE SHIELD | Source: Ambulatory Visit | Attending: Internal Medicine | Admitting: Internal Medicine

## 2017-10-16 DIAGNOSIS — N200 Calculus of kidney: Secondary | ICD-10-CM | POA: Diagnosis not present

## 2017-10-16 DIAGNOSIS — Z01419 Encounter for gynecological examination (general) (routine) without abnormal findings: Secondary | ICD-10-CM | POA: Diagnosis not present

## 2017-10-16 DIAGNOSIS — Z1211 Encounter for screening for malignant neoplasm of colon: Secondary | ICD-10-CM | POA: Diagnosis not present

## 2017-10-16 DIAGNOSIS — K635 Polyp of colon: Secondary | ICD-10-CM | POA: Diagnosis not present

## 2017-10-16 DIAGNOSIS — Z23 Encounter for immunization: Secondary | ICD-10-CM | POA: Diagnosis not present

## 2017-10-16 DIAGNOSIS — Z1231 Encounter for screening mammogram for malignant neoplasm of breast: Secondary | ICD-10-CM | POA: Diagnosis not present

## 2017-11-10 DIAGNOSIS — Z87442 Personal history of urinary calculi: Secondary | ICD-10-CM | POA: Diagnosis not present

## 2017-11-10 DIAGNOSIS — K219 Gastro-esophageal reflux disease without esophagitis: Secondary | ICD-10-CM | POA: Diagnosis not present

## 2017-11-10 DIAGNOSIS — J3089 Other allergic rhinitis: Secondary | ICD-10-CM | POA: Diagnosis not present

## 2017-11-27 DIAGNOSIS — M810 Age-related osteoporosis without current pathological fracture: Secondary | ICD-10-CM | POA: Diagnosis not present

## 2018-02-08 DIAGNOSIS — Z6823 Body mass index (BMI) 23.0-23.9, adult: Secondary | ICD-10-CM | POA: Diagnosis not present

## 2018-02-08 DIAGNOSIS — H8110 Benign paroxysmal vertigo, unspecified ear: Secondary | ICD-10-CM | POA: Diagnosis not present

## 2018-02-08 DIAGNOSIS — H6980 Other specified disorders of Eustachian tube, unspecified ear: Secondary | ICD-10-CM | POA: Diagnosis not present

## 2018-03-14 DIAGNOSIS — H608X3 Other otitis externa, bilateral: Secondary | ICD-10-CM | POA: Diagnosis not present

## 2018-03-14 DIAGNOSIS — H9313 Tinnitus, bilateral: Secondary | ICD-10-CM | POA: Diagnosis not present

## 2018-03-14 DIAGNOSIS — Z822 Family history of deafness and hearing loss: Secondary | ICD-10-CM | POA: Diagnosis not present

## 2018-03-14 DIAGNOSIS — Z87891 Personal history of nicotine dependence: Secondary | ICD-10-CM | POA: Diagnosis not present

## 2018-07-30 DIAGNOSIS — N390 Urinary tract infection, site not specified: Secondary | ICD-10-CM | POA: Diagnosis not present

## 2018-08-23 DIAGNOSIS — N390 Urinary tract infection, site not specified: Secondary | ICD-10-CM | POA: Diagnosis not present

## 2018-09-07 DIAGNOSIS — N390 Urinary tract infection, site not specified: Secondary | ICD-10-CM | POA: Diagnosis not present

## 2018-09-07 DIAGNOSIS — B9620 Unspecified Escherichia coli [E. coli] as the cause of diseases classified elsewhere: Secondary | ICD-10-CM | POA: Diagnosis not present

## 2018-09-07 DIAGNOSIS — N202 Calculus of kidney with calculus of ureter: Secondary | ICD-10-CM | POA: Diagnosis not present

## 2018-09-12 DIAGNOSIS — N390 Urinary tract infection, site not specified: Secondary | ICD-10-CM | POA: Diagnosis not present

## 2018-09-12 DIAGNOSIS — N200 Calculus of kidney: Secondary | ICD-10-CM | POA: Diagnosis not present

## 2018-09-13 DIAGNOSIS — N200 Calculus of kidney: Secondary | ICD-10-CM | POA: Diagnosis not present

## 2018-11-07 DIAGNOSIS — R3129 Other microscopic hematuria: Secondary | ICD-10-CM | POA: Diagnosis not present

## 2018-11-07 DIAGNOSIS — B9620 Unspecified Escherichia coli [E. coli] as the cause of diseases classified elsewhere: Secondary | ICD-10-CM | POA: Diagnosis not present

## 2018-11-07 DIAGNOSIS — N390 Urinary tract infection, site not specified: Secondary | ICD-10-CM | POA: Diagnosis not present

## 2018-11-07 DIAGNOSIS — N200 Calculus of kidney: Secondary | ICD-10-CM | POA: Diagnosis not present

## 2018-11-23 DIAGNOSIS — Z23 Encounter for immunization: Secondary | ICD-10-CM | POA: Diagnosis not present

## 2018-12-17 DIAGNOSIS — E7849 Other hyperlipidemia: Secondary | ICD-10-CM | POA: Diagnosis not present

## 2018-12-17 DIAGNOSIS — M810 Age-related osteoporosis without current pathological fracture: Secondary | ICD-10-CM | POA: Diagnosis not present

## 2018-12-24 DIAGNOSIS — H8110 Benign paroxysmal vertigo, unspecified ear: Secondary | ICD-10-CM | POA: Diagnosis not present

## 2018-12-24 DIAGNOSIS — H6980 Other specified disorders of Eustachian tube, unspecified ear: Secondary | ICD-10-CM | POA: Diagnosis not present

## 2018-12-24 DIAGNOSIS — E7849 Other hyperlipidemia: Secondary | ICD-10-CM | POA: Diagnosis not present

## 2018-12-24 DIAGNOSIS — Z1331 Encounter for screening for depression: Secondary | ICD-10-CM | POA: Diagnosis not present

## 2018-12-24 DIAGNOSIS — Z0000 Encounter for general adult medical examination without abnormal findings: Secondary | ICD-10-CM | POA: Diagnosis not present

## 2018-12-24 DIAGNOSIS — N390 Urinary tract infection, site not specified: Secondary | ICD-10-CM | POA: Diagnosis not present

## 2018-12-31 DIAGNOSIS — R739 Hyperglycemia, unspecified: Secondary | ICD-10-CM | POA: Diagnosis not present

## 2018-12-31 DIAGNOSIS — E7849 Other hyperlipidemia: Secondary | ICD-10-CM | POA: Diagnosis not present

## 2019-02-04 DIAGNOSIS — N200 Calculus of kidney: Secondary | ICD-10-CM | POA: Diagnosis not present

## 2019-02-04 DIAGNOSIS — B9620 Unspecified Escherichia coli [E. coli] as the cause of diseases classified elsewhere: Secondary | ICD-10-CM | POA: Diagnosis not present

## 2019-02-04 DIAGNOSIS — N390 Urinary tract infection, site not specified: Secondary | ICD-10-CM | POA: Diagnosis not present

## 2019-04-22 DIAGNOSIS — H608X3 Other otitis externa, bilateral: Secondary | ICD-10-CM | POA: Diagnosis not present

## 2019-04-22 DIAGNOSIS — L299 Pruritus, unspecified: Secondary | ICD-10-CM | POA: Diagnosis not present

## 2019-05-01 DIAGNOSIS — N390 Urinary tract infection, site not specified: Secondary | ICD-10-CM | POA: Diagnosis not present

## 2019-05-01 DIAGNOSIS — R350 Frequency of micturition: Secondary | ICD-10-CM | POA: Diagnosis not present

## 2019-09-09 DIAGNOSIS — R1033 Periumbilical pain: Secondary | ICD-10-CM | POA: Diagnosis not present

## 2019-09-09 DIAGNOSIS — R112 Nausea with vomiting, unspecified: Secondary | ICD-10-CM | POA: Diagnosis not present

## 2019-09-09 DIAGNOSIS — K5900 Constipation, unspecified: Secondary | ICD-10-CM | POA: Diagnosis not present

## 2019-10-21 DIAGNOSIS — N390 Urinary tract infection, site not specified: Secondary | ICD-10-CM | POA: Diagnosis not present

## 2019-11-19 DIAGNOSIS — N390 Urinary tract infection, site not specified: Secondary | ICD-10-CM | POA: Diagnosis not present

## 2019-11-26 DIAGNOSIS — R350 Frequency of micturition: Secondary | ICD-10-CM | POA: Diagnosis not present

## 2019-12-24 DIAGNOSIS — R7301 Impaired fasting glucose: Secondary | ICD-10-CM | POA: Diagnosis not present

## 2019-12-24 DIAGNOSIS — E785 Hyperlipidemia, unspecified: Secondary | ICD-10-CM | POA: Diagnosis not present

## 2019-12-24 DIAGNOSIS — M810 Age-related osteoporosis without current pathological fracture: Secondary | ICD-10-CM | POA: Diagnosis not present

## 2019-12-27 DIAGNOSIS — Z0000 Encounter for general adult medical examination without abnormal findings: Secondary | ICD-10-CM | POA: Diagnosis not present

## 2019-12-27 DIAGNOSIS — H8110 Benign paroxysmal vertigo, unspecified ear: Secondary | ICD-10-CM | POA: Diagnosis not present

## 2019-12-27 DIAGNOSIS — M810 Age-related osteoporosis without current pathological fracture: Secondary | ICD-10-CM | POA: Diagnosis not present

## 2019-12-27 DIAGNOSIS — R82998 Other abnormal findings in urine: Secondary | ICD-10-CM | POA: Diagnosis not present

## 2019-12-27 DIAGNOSIS — R7301 Impaired fasting glucose: Secondary | ICD-10-CM | POA: Diagnosis not present

## 2020-07-02 ENCOUNTER — Other Ambulatory Visit: Payer: Self-pay | Admitting: Internal Medicine

## 2020-07-02 DIAGNOSIS — Z1231 Encounter for screening mammogram for malignant neoplasm of breast: Secondary | ICD-10-CM

## 2020-08-26 ENCOUNTER — Ambulatory Visit
Admission: RE | Admit: 2020-08-26 | Discharge: 2020-08-26 | Disposition: A | Discharge status: Home / Self Care | Payer: Medicare Other | Source: Ambulatory Visit | Attending: Internal Medicine | Admitting: Internal Medicine

## 2020-08-26 ENCOUNTER — Other Ambulatory Visit: Payer: Self-pay

## 2020-08-26 DIAGNOSIS — Z1231 Encounter for screening mammogram for malignant neoplasm of breast: Secondary | ICD-10-CM | POA: Diagnosis not present

## 2020-12-17 DIAGNOSIS — Z23 Encounter for immunization: Secondary | ICD-10-CM | POA: Diagnosis not present

## 2021-02-04 DIAGNOSIS — R300 Dysuria: Secondary | ICD-10-CM | POA: Diagnosis not present

## 2021-02-04 DIAGNOSIS — N390 Urinary tract infection, site not specified: Secondary | ICD-10-CM | POA: Diagnosis not present

## 2021-02-10 DIAGNOSIS — R7301 Impaired fasting glucose: Secondary | ICD-10-CM | POA: Diagnosis not present

## 2021-02-10 DIAGNOSIS — E785 Hyperlipidemia, unspecified: Secondary | ICD-10-CM | POA: Diagnosis not present

## 2021-02-10 DIAGNOSIS — M810 Age-related osteoporosis without current pathological fracture: Secondary | ICD-10-CM | POA: Diagnosis not present

## 2021-02-17 ENCOUNTER — Other Ambulatory Visit: Payer: Self-pay | Admitting: Internal Medicine

## 2021-02-17 DIAGNOSIS — R82998 Other abnormal findings in urine: Secondary | ICD-10-CM | POA: Diagnosis not present

## 2021-02-17 DIAGNOSIS — E785 Hyperlipidemia, unspecified: Secondary | ICD-10-CM

## 2021-03-17 ENCOUNTER — Ambulatory Visit
Admission: RE | Admit: 2021-03-17 | Discharge: 2021-03-17 | Disposition: A | Discharge status: Home / Self Care | Payer: No Typology Code available for payment source | Source: Ambulatory Visit | Attending: Internal Medicine | Admitting: Internal Medicine

## 2021-03-17 DIAGNOSIS — E785 Hyperlipidemia, unspecified: Secondary | ICD-10-CM

## 2021-03-23 DIAGNOSIS — E785 Hyperlipidemia, unspecified: Secondary | ICD-10-CM | POA: Diagnosis not present

## 2021-07-28 ENCOUNTER — Other Ambulatory Visit: Payer: Self-pay | Admitting: Internal Medicine

## 2021-07-28 DIAGNOSIS — Z1231 Encounter for screening mammogram for malignant neoplasm of breast: Secondary | ICD-10-CM

## 2021-08-31 ENCOUNTER — Ambulatory Visit
Admission: RE | Admit: 2021-08-31 | Discharge: 2021-08-31 | Disposition: A | Discharge status: Home / Self Care | Payer: Medicare Other | Source: Ambulatory Visit | Attending: Internal Medicine | Admitting: Internal Medicine

## 2021-08-31 DIAGNOSIS — Z1231 Encounter for screening mammogram for malignant neoplasm of breast: Secondary | ICD-10-CM

## 2021-11-11 DIAGNOSIS — H2513 Age-related nuclear cataract, bilateral: Secondary | ICD-10-CM | POA: Diagnosis not present

## 2021-11-11 DIAGNOSIS — H04123 Dry eye syndrome of bilateral lacrimal glands: Secondary | ICD-10-CM | POA: Diagnosis not present

## 2021-11-15 DIAGNOSIS — H524 Presbyopia: Secondary | ICD-10-CM | POA: Diagnosis not present

## 2022-02-22 DIAGNOSIS — K219 Gastro-esophageal reflux disease without esophagitis: Secondary | ICD-10-CM | POA: Diagnosis not present

## 2022-02-22 DIAGNOSIS — M810 Age-related osteoporosis without current pathological fracture: Secondary | ICD-10-CM | POA: Diagnosis not present

## 2022-02-22 DIAGNOSIS — E785 Hyperlipidemia, unspecified: Secondary | ICD-10-CM | POA: Diagnosis not present

## 2022-02-22 DIAGNOSIS — R7301 Impaired fasting glucose: Secondary | ICD-10-CM | POA: Diagnosis not present

## 2022-02-28 DIAGNOSIS — Z0000 Encounter for general adult medical examination without abnormal findings: Secondary | ICD-10-CM | POA: Diagnosis not present

## 2022-02-28 DIAGNOSIS — H6980 Other specified disorders of Eustachian tube, unspecified ear: Secondary | ICD-10-CM | POA: Diagnosis not present

## 2022-02-28 DIAGNOSIS — Z1339 Encounter for screening examination for other mental health and behavioral disorders: Secondary | ICD-10-CM | POA: Diagnosis not present

## 2022-02-28 DIAGNOSIS — E785 Hyperlipidemia, unspecified: Secondary | ICD-10-CM | POA: Diagnosis not present

## 2022-02-28 DIAGNOSIS — M810 Age-related osteoporosis without current pathological fracture: Secondary | ICD-10-CM | POA: Diagnosis not present

## 2022-02-28 DIAGNOSIS — I2510 Atherosclerotic heart disease of native coronary artery without angina pectoris: Secondary | ICD-10-CM | POA: Diagnosis not present

## 2022-02-28 DIAGNOSIS — Z1331 Encounter for screening for depression: Secondary | ICD-10-CM | POA: Diagnosis not present

## 2022-05-03 DIAGNOSIS — R7989 Other specified abnormal findings of blood chemistry: Secondary | ICD-10-CM | POA: Diagnosis not present

## 2022-05-03 DIAGNOSIS — E785 Hyperlipidemia, unspecified: Secondary | ICD-10-CM | POA: Diagnosis not present

## 2022-08-04 ENCOUNTER — Other Ambulatory Visit: Payer: Self-pay | Admitting: Internal Medicine

## 2022-08-04 DIAGNOSIS — Z1231 Encounter for screening mammogram for malignant neoplasm of breast: Secondary | ICD-10-CM

## 2022-09-19 ENCOUNTER — Ambulatory Visit
Admission: RE | Admit: 2022-09-19 | Discharge: 2022-09-19 | Disposition: A | Discharge status: Home / Self Care | Payer: Medicare Other | Source: Ambulatory Visit | Attending: Internal Medicine | Admitting: Internal Medicine

## 2022-09-19 DIAGNOSIS — Z1231 Encounter for screening mammogram for malignant neoplasm of breast: Secondary | ICD-10-CM | POA: Diagnosis not present

## 2023-04-05 ENCOUNTER — Encounter: Payer: Self-pay | Admitting: Gastroenterology

## 2023-04-06 DIAGNOSIS — R112 Nausea with vomiting, unspecified: Secondary | ICD-10-CM | POA: Diagnosis not present

## 2023-04-06 DIAGNOSIS — K219 Gastro-esophageal reflux disease without esophagitis: Secondary | ICD-10-CM | POA: Diagnosis not present

## 2023-05-09 DIAGNOSIS — E785 Hyperlipidemia, unspecified: Secondary | ICD-10-CM | POA: Diagnosis not present

## 2023-05-09 DIAGNOSIS — Z1212 Encounter for screening for malignant neoplasm of rectum: Secondary | ICD-10-CM | POA: Diagnosis not present

## 2023-05-09 DIAGNOSIS — R7301 Impaired fasting glucose: Secondary | ICD-10-CM | POA: Diagnosis not present

## 2023-05-09 DIAGNOSIS — M810 Age-related osteoporosis without current pathological fracture: Secondary | ICD-10-CM | POA: Diagnosis not present

## 2023-05-19 DIAGNOSIS — E785 Hyperlipidemia, unspecified: Secondary | ICD-10-CM | POA: Diagnosis not present

## 2023-05-19 DIAGNOSIS — Z1339 Encounter for screening examination for other mental health and behavioral disorders: Secondary | ICD-10-CM | POA: Diagnosis not present

## 2023-05-19 DIAGNOSIS — Z0000 Encounter for general adult medical examination without abnormal findings: Secondary | ICD-10-CM | POA: Diagnosis not present

## 2023-05-19 DIAGNOSIS — Z1331 Encounter for screening for depression: Secondary | ICD-10-CM | POA: Diagnosis not present

## 2023-05-19 DIAGNOSIS — R82998 Other abnormal findings in urine: Secondary | ICD-10-CM | POA: Diagnosis not present

## 2023-05-19 DIAGNOSIS — M810 Age-related osteoporosis without current pathological fracture: Secondary | ICD-10-CM | POA: Diagnosis not present

## 2023-05-19 DIAGNOSIS — J069 Acute upper respiratory infection, unspecified: Secondary | ICD-10-CM | POA: Diagnosis not present

## 2023-07-05 DIAGNOSIS — J069 Acute upper respiratory infection, unspecified: Secondary | ICD-10-CM | POA: Diagnosis not present

## 2023-07-05 DIAGNOSIS — R051 Acute cough: Secondary | ICD-10-CM | POA: Diagnosis not present

## 2023-07-11 ENCOUNTER — Encounter: Payer: Self-pay | Admitting: Gastroenterology

## 2023-07-11 ENCOUNTER — Ambulatory Visit: Admitting: Gastroenterology

## 2023-07-11 VITALS — BP 114/72 | HR 73 | Ht 64.0 in | Wt 126.2 lb

## 2023-07-11 DIAGNOSIS — R112 Nausea with vomiting, unspecified: Secondary | ICD-10-CM

## 2023-07-11 DIAGNOSIS — R140 Abdominal distension (gaseous): Secondary | ICD-10-CM

## 2023-07-11 DIAGNOSIS — K219 Gastro-esophageal reflux disease without esophagitis: Secondary | ICD-10-CM

## 2023-07-11 DIAGNOSIS — G8929 Other chronic pain: Secondary | ICD-10-CM

## 2023-07-11 NOTE — Patient Instructions (Signed)
 VISIT SUMMARY:  Today, we discussed your gastrointestinal symptoms, which include nausea, vomiting, and diarrhea. You have been experiencing these symptoms every three months, but they have improved significantly with your current medications and dietary changes. We also reviewed your history of kidney stones and your recent improvements in digestion and bowel regularity.  YOUR PLAN:  -GASTROESOPHAGEAL REFLUX DISEASE (GERD): GERD is a condition where stomach acid frequently flows back into the tube connecting your mouth and stomach, causing irritation. Your symptoms have improved with omeprazole and Align, and we will continue to monitor your progress. We will order an ultrasound of your gallbladder to rule out other potential issues like cholecystitis or gallstones. An upper endoscopy is scheduled for September 08, 2023, to check for gastritis, H. pylori infection, ulcers, or tumors. We will gradually taper off omeprazole to see if you still need it and continue Align until your current supply is finished. Please continue with dietary modifications, including more fruits, vegetables, and whole grains.  You have been scheduled for an abdominal ultrasound at [facility] (1st floor of hospital) on 07/20/2023 at 9:30 am. Please arrive 30 minutes prior to your appointment for registration. Make certain not to have anything to eat or drink 6 hours prior to your appointment. Should you need to reschedule your appointment, please contact radiology at [phone]. This test typically takes about 30 minutes to perform.    INSTRUCTIONS:  Please follow up with the ultrasound of your gallbladder and attend the scheduled upper endoscopy on September 08, 2023. Continue with the dietary modifications and gradually taper off omeprazole as discussed. Finish your current supply of Align before discontinuing it.  Due to recent changes in healthcare laws, you may see the results of your imaging and laboratory  studies on MyChart before your provider has had a chance to review them.  We understand that in some cases there may be results that are confusing or concerning to you. Not all laboratory results come back in the same time frame and the provider may be waiting for multiple results in order to interpret others.  Please give us  48 hours in order for your provider to thoroughly review all the results before contacting the office for clarification of your results.    _______________________________________________________  If your blood pressure at your visit was 140/90 or greater, please contact your primary care physician to follow up on this.  _______________________________________________________  If you are age 93 or older, your body mass index should be between 23-30. Your Body mass index is 21.67 kg/m. If this is out of the aforementioned range listed, please consider follow up with your Primary Care Provider.  If you are age 35 or younger, your body mass index should be between 19-25. Your Body mass index is 21.67 kg/m. If this is out of the aformentioned range listed, please consider follow up with your Primary Care Provider.   ________________________________________________________  The  GI providers would like to encourage you to use MYCHART to communicate with providers for non-urgent requests or questions.  Due to long hold times on the telephone, sending your provider a message by [facility] may be a faster and more efficient way to get a response.  Please allow 48 business hours for a response.  Please remember that this is for non-urgent requests.  _______________________________________________________   I appreciate the  opportunity to care for you  Thank You   Kavitha Nandigam , MD

## 2023-07-11 NOTE — Progress Notes (Unsigned)
 Yvonne Bryant    [id number]    [birth year]  Primary Care Physician:Perini, Oneil, MD  Referring Physician: Shayne Oneil, MD [address] [place],  [place] [address]   Chief complaint:  nausea, vomiting, abdominal bloating, IBS with diarrhea and constipation  Discussed the use of AI scribe software for clinical note transcription with the patient, who gave verbal consent to proceed.  History of Present Illness Yvonne Bryant is a 68 year old female who presents with gastrointestinal symptoms including nausea, vomiting, and diarrhea. She was referred by Dr. Shayne for evaluation of her gastrointestinal symptoms.  She experiences gastrointestinal symptoms every three months, including weight gain of two to three pounds, nausea, vomiting, diarrhea, and bloating. These episodes are severe but resolve, leading to significant improvement.  She has been taking omeprazole and Align for the past two months, which have significantly reduced heartburn, nausea, and improved digestion. Her bowel movements are more regular, occurring almost daily, and she no longer experiences constipation. She previously had undigested food during vomiting episodes, which has improved with her current medications.  She has not started Boniva due to a recent cold and treatment with a Z-Pak. She denies regular use of naproxen or NSAIDs, only using Tylenol occasionally for cold symptoms. She has a history of kidney stones but has not had an ultrasound to check her gallbladder.  Her dietary habits have changed since retiring a year ago, with a shift to earlier and lighter meals, which she believes has contributed to her improvement. She recalls previous episodes of sulfur-smelling burps and undigested food, which have resolved with her current treatment regimen.  No current heartburn, nausea, constipation, blood in stool, black stool, or significant stomach pain. Reports improvement in bowel regularity and  digestion.  Colonoscopy 09/09/2016 - One 7 mm polyp in the sigmoid colon, removed with a cold snare. Resected and retrieved. Treated with a monopolar probe. - Non- bleeding internal hemorrhoids. - The examination was otherwise normal. Surgical [P], sigmoid, polyp - HYPERPLASTIC POLYP (ONE FRAGMENT). - NO ADENOMATOUS CHANGE OR MALIGNANCY.  Outpatient Encounter Medications as of 07/11/2023  Medication Sig   naproxen sodium (ANAPROX) 220 MG tablet Take 220 mg by mouth 2 (two) times daily as needed (PAIN).   Facility-Administered Encounter Medications as of 07/11/2023  Medication   0.9 %  sodium chloride  infusion    Allergies as of 07/11/2023 - Review Complete 09/09/2016  Allergen Reaction Noted   Dairy aid [tilactase]  07/08/2013   Iodine Other (See Comments) 04/06/2011   Other  08/26/2016   Strawberry extract  06/17/2013   Prednisone Palpitations 04/06/2011    Past Medical History:  Diagnosis Date   Allergy    Environmental allergies    GERD (gastroesophageal reflux disease)    Kidney stones     Past Surgical History:  Procedure Laterality Date   LITHOTRIPSY     TONSILLECTOMY     WISDOM TOOTH EXTRACTION      Family History  Problem Relation Age of Onset   Uterine cancer Unknown    Skin cancer Unknown    Colon cancer Neg Hx    Esophageal cancer Neg Hx    Rectal cancer Neg Hx    Stomach cancer Neg Hx     Social History   Socioeconomic History   Marital status: Married    Spouse name: Not on file   Number of children: Not on file   Years of education: Not on file  Highest education level: Not on file  Occupational History   Not on file  Tobacco Use   Smoking status: Former    Current packs/day: 0.00    Average packs/day: 1 pack/day for 20.0 years (20.0 ttl pk-yrs)    Types: Cigarettes    Start date: 04/06/1983    Quit date: 04/06/2003    Years since quitting: 20.2   Smokeless tobacco: Never  Vaping Use   Vaping status: Never Used  Substance and Sexual  Activity   Alcohol  use: No    Comment: rarely drinks   Drug use: No   Sexual activity: Yes    Comment: 1 partner in last 12 months  Other Topics Concern   Not on file  Social History Narrative   Not on file   Social Drivers of Health   Financial Resource Strain: Not on file  Food Insecurity: Not on file  Transportation Needs: Not on file  Physical Activity: Not on file  Stress: Not on file  Social Connections: Not on file  Intimate Partner Violence: Not on file      Review of systems: All other review of systems negative except as mentioned in the HPI.   Physical Exam: Vitals:   07/11/23 0839  BP: 114/72  Pulse: 73  SpO2: 97%   Body mass index is 21.67 kg/m. Gen:      No acute distress HEENT:  sclera anicteric Abd:      soft, non-tender; no palpable masses, no distension Ext:    No edema Neuro: alert and oriented x 3 Psych: normal mood and affect  Data Reviewed:  Reviewed labs, radiology imaging, old records and pertinent past GI work up     Assessment and Plan Assessment & Plan Gastroesophageal reflux disease (GERD) Chronic GERD with episodes of severe vomiting , epigastric pain, abdominal bloating every three months. Symptoms have improved significantly with omeprazole and Align. Recent dietary changes and retirement have also contributed to symptom improvement. Differential diagnosis includes cholecystitis, gastritis, H. pylori infection, and rare causes like ulcers or tumors. - Order ultrasound of the gallbladder to rule out cholecystitis or gallstones. - Schedule upper endoscopy for September 08, 2023, to evaluate for gastritis, H. pylori infection, ulcers, or tumors. Discuss potential risks including anesthesia risks, bleeding, and perforation, though these are rare. - Taper off omeprazole gradually to assess necessity and avoid rebound acid symptoms. - Continue Align until current supply is finished, then discontinue to evaluate long-term necessity. -  Advise dietary modifications with more fruits, vegetables, and whole grains.   The patient was provided an opportunity to ask questions and all were answered. The patient agreed with the plan and demonstrated an understanding of the instructions.  Yvonne Bryant , MD    CC: Shayne Anes, MD

## 2023-07-12 ENCOUNTER — Encounter: Payer: Self-pay | Admitting: Gastroenterology

## 2023-07-20 ENCOUNTER — Other Ambulatory Visit (HOSPITAL_COMMUNITY)

## 2023-07-28 ENCOUNTER — Ambulatory Visit (HOSPITAL_COMMUNITY)
Admission: RE | Admit: 2023-07-28 | Discharge: 2023-07-28 | Disposition: A | Discharge status: Home / Self Care | Source: Ambulatory Visit | Attending: Gastroenterology | Admitting: Gastroenterology

## 2023-07-28 DIAGNOSIS — G8929 Other chronic pain: Secondary | ICD-10-CM | POA: Insufficient documentation

## 2023-07-28 DIAGNOSIS — R1013 Epigastric pain: Secondary | ICD-10-CM | POA: Diagnosis not present

## 2023-07-28 DIAGNOSIS — R112 Nausea with vomiting, unspecified: Secondary | ICD-10-CM | POA: Insufficient documentation

## 2023-07-28 DIAGNOSIS — K7689 Other specified diseases of liver: Secondary | ICD-10-CM | POA: Diagnosis not present

## 2023-07-28 DIAGNOSIS — K219 Gastro-esophageal reflux disease without esophagitis: Secondary | ICD-10-CM | POA: Diagnosis not present

## 2023-07-28 DIAGNOSIS — R140 Abdominal distension (gaseous): Secondary | ICD-10-CM | POA: Diagnosis not present

## 2023-07-28 DIAGNOSIS — R1011 Right upper quadrant pain: Secondary | ICD-10-CM | POA: Diagnosis not present

## 2023-07-31 ENCOUNTER — Ambulatory Visit: Payer: Self-pay | Admitting: Gastroenterology

## 2023-09-08 ENCOUNTER — Encounter: Payer: Self-pay | Admitting: Gastroenterology

## 2023-09-08 ENCOUNTER — Ambulatory Visit: Admitting: Gastroenterology

## 2023-09-08 VITALS — BP 111/63 | HR 65 | Temp 97.7°F | Resp 15 | Ht 64.0 in | Wt 126.0 lb

## 2023-09-08 DIAGNOSIS — K449 Diaphragmatic hernia without obstruction or gangrene: Secondary | ICD-10-CM | POA: Diagnosis not present

## 2023-09-08 DIAGNOSIS — K3189 Other diseases of stomach and duodenum: Secondary | ICD-10-CM | POA: Diagnosis not present

## 2023-09-08 DIAGNOSIS — R140 Abdominal distension (gaseous): Secondary | ICD-10-CM

## 2023-09-08 DIAGNOSIS — R1013 Epigastric pain: Secondary | ICD-10-CM | POA: Diagnosis not present

## 2023-09-08 DIAGNOSIS — G8929 Other chronic pain: Secondary | ICD-10-CM

## 2023-09-08 MED ORDER — SODIUM CHLORIDE 0.9 % IV SOLN: 500.0000 mL | Freq: Once | INTRAVENOUS | Status: AC

## 2023-09-08 NOTE — Patient Instructions (Addendum)
 Resume previous diet.                           - Continue present medications.                           - No aspirin, ibuprofen, naproxen, or other                            non-steroidal anti-inflammatory drugs.                           - Follow an antireflux regimen.     YOU HAD AN ENDOSCOPIC PROCEDURE TODAY AT THE [facility]:   Refer to the procedure report that was given to you for any specific questions about what was found during the examination.  If the procedure report does not answer your questions, please call your gastroenterologist to clarify.  If you requested that your care partner not be given the details of your procedure findings, then the procedure report has been included in a sealed envelope for you to review at your convenience later.  YOU SHOULD EXPECT: Some feelings of bloating in the abdomen. Passage of more gas than usual.  Walking can help get rid of the air that was put into your GI tract during the procedure and reduce the bloating. If you had a lower endoscopy (such as a colonoscopy or flexible sigmoidoscopy) you may notice spotting of blood in your stool or on the toilet paper. If you underwent a bowel prep for your procedure, you may not have a normal bowel movement for a few days.  Please Note:  You might notice some irritation and congestion in your nose or some drainage.  This is from the oxygen used during your procedure.  There is no need for concern and it should clear up in a day or so.  SYMPTOMS TO REPORT IMMEDIATELY:  Following lower endoscopy (colonoscopy or flexible sigmoidoscopy):  Excessive amounts of blood in the stool  Significant tenderness or worsening of abdominal pains  Swelling of the abdomen that is new, acute  Fever of 100F or higher  Following upper endoscopy (EGD)  Vomiting of blood or coffee ground material  New chest pain or pain under the shoulder blades  Painful or persistently difficult swallowing  New shortness  of breath  Fever of 100F or higher  Black, tarry-looking stools  For urgent or emergent issues, a gastroenterologist can be reached at any hour by calling [phone]. Do not use MyChart messaging for urgent concerns.    DIET:  We do recommend a small meal at first, but then you may proceed to your regular diet.  Drink plenty of fluids but you should avoid alcoholic beverages for 24 hours.  ACTIVITY:  You should plan to take it easy for the rest of today and you should NOT DRIVE or use heavy machinery until tomorrow (because of the sedation medicines used during the test).    FOLLOW UP: Our staff will call the number listed on your records the next business day following your procedure.  We will call around 7:15- 8:00 am to check on you and address any questions or concerns that you may have regarding the information given to you following your procedure. If we do not reach you, we will leave a message.  If any biopsies were taken you will be contacted by phone or by letter within the next 1-3 weeks.  Please call us  at [phone] if you have not heard about the biopsies in 3 weeks.    SIGNATURES/CONFIDENTIALITY: You and/or your care partner have signed paperwork which will be entered into your electronic medical record.  These signatures attest to the fact that that the information above on your After Visit Summary has been reviewed and is understood.  Full responsibility of the confidentiality of this discharge information lies with you and/or your care-partner.

## 2023-09-08 NOTE — Op Note (Signed)
 [facility] Patient Name: Yvonne Bryant Procedure Date: 09/08/2023 7:11 AM MRN: [id number] Endoscopist: Gustav ALONSO Mcgee , MD, [id number] Age: 68 Referring MD:  Date of Birth: 20-May-1955 Gender: Female Account #: [id number] Procedure:                Upper GI endoscopy Indications:              Epigastric abdominal pain Procedure:                Pre-Anesthesia Assessment:                           - Prior to the procedure, a History and Physical                            was performed, and patient medications and                            allergies were reviewed. The patient's tolerance of                            previous anesthesia was also reviewed. The risks                            and benefits of the procedure and the sedation                            options and risks were discussed with the patient.                            All questions were answered, and informed consent                            was obtained. Prior Anticoagulants: The patient has                            taken no anticoagulant or antiplatelet agents. ASA                            Grade Assessment: II - A patient with mild systemic                            disease. After reviewing the risks and benefits,                            the patient was deemed in satisfactory condition to                            undergo the procedure.                           After obtaining informed consent, the endoscope was                            passed under direct vision. Throughout the  procedure, the patient's blood pressure, pulse, and                            oxygen saturations were monitored continuously. The                            Olympus Scope [id number] was introduced through the                            mouth, and advanced to the second part of duodenum.                            The upper GI endoscopy was accomplished without                             difficulty. The patient tolerated the procedure                            well. Scope In: Scope Out: Findings:                 The Z-line was regular and was found 36 cm from the                            incisors.                           A small hiatal hernia was present.                           Striped mildly erythematous mucosa without bleeding                            was found in the cardia.                           The exam of the stomach was otherwise normal.                           The examined duodenum was normal. Complications:            No immediate complications. Estimated Blood Loss:     Estimated blood loss was minimal. Impression:               - Z-line regular, 36 cm from the incisors.                           - Small hiatal hernia.                           - Erythematous mucosa in the cardia.                           - Normal examined duodenum.                           - No specimens collected. Recommendation:           -  Resume previous diet.                           - Continue present medications.                           - No aspirin, ibuprofen, naproxen, or other                            non-steroidal anti-inflammatory drugs.                           - Follow an antireflux regimen. Kiegan Macaraeg V. Emilynn Srinivasan, MD 09/08/2023 8:34:36 AM This report has been signed electronically.

## 2023-09-08 NOTE — Progress Notes (Signed)
 Report given to PACU, vss

## 2023-09-08 NOTE — Progress Notes (Signed)
 [facility] History and Physical   Primary Care Physician:  Shayne Anes, MD   Reason for Procedure:  Epigastric pain  Plan:    EGD  with possible interventions as needed     HPI: Yvonne Bryant is a very pleasant 68 y.o. female here for EGD for evaluation of epigastric pain.   The risks and benefits as well as alternatives of endoscopic procedure(s) have been discussed and reviewed. All questions answered. The patient agrees to proceed.    Past Medical History:  Diagnosis Date   Allergy    Eczema    Environmental allergies    GERD (gastroesophageal reflux disease)    Kidney stones     Past Surgical History:  Procedure Laterality Date   LITHOTRIPSY     TONSILLECTOMY     WISDOM TOOTH EXTRACTION      Prior to Admission medications   Medication Sig Start Date End Date Taking? Authorizing Provider  LIPITOR 40 MG tablet Take 40 mg by mouth daily.   Yes [provider]  Probiotic Product (ALIGN PO) Take 1 capsule by mouth daily.   Yes [provider]  Specialty Vitamins Products (COLLAGEN ULTRA) CAPS Take 1 capsule by mouth daily.   Yes [provider]  hydrocortisone valerate cream (WESTCORT) 0.2 % Apply 1 Application topically as needed. 06/17/19   [provider]  ibandronate (BONIVA) 150 MG tablet Take 150 mg by mouth every 30 (thirty) days. 05/19/23   [provider]  naproxen sodium (ANAPROX) 220 MG tablet Take 220 mg by mouth 2 (two) times daily as needed (PAIN).    [provider]  omeprazole (PRILOSEC) 20 MG capsule Take 20 mg by mouth daily. 01/21/18   [provider]    Current Outpatient Medications  Medication Sig Dispense Refill   LIPITOR 40 MG tablet Take 40 mg by mouth daily.     Probiotic Product (ALIGN PO) Take 1 capsule by mouth daily.     Specialty Vitamins Products (COLLAGEN ULTRA) CAPS Take 1 capsule by mouth daily.     hydrocortisone valerate cream (WESTCORT) 0.2 % Apply 1  Application topically as needed.     ibandronate (BONIVA) 150 MG tablet Take 150 mg by mouth every 30 (thirty) days.     naproxen sodium (ANAPROX) 220 MG tablet Take 220 mg by mouth 2 (two) times daily as needed (PAIN).     omeprazole (PRILOSEC) 20 MG capsule Take 20 mg by mouth daily.     Current Facility-Administered Medications  Medication Dose Route Frequency Provider Last Rate Last Admin   0.9 %  sodium chloride  infusion  500 mL Intravenous Continuous [name], MD       0.9 %  sodium chloride  infusion  500 mL Intravenous Once [name], MD        Allergies as of 09/08/2023 - Review Complete 09/08/2023  Allergen Reaction Noted   Dairy aid [tilactase]  07/08/2013   Iodine Other (See Comments) 04/06/2011   Other Other (See Comments) 08/26/2016   Prednisone Palpitations 04/06/2011   Strawberry extract Other (See Comments) 06/17/2013    Family History  Problem Relation Age of Onset   Atrial fibrillation Father    Uterine cancer Other    Skin cancer Other    Colon cancer Neg Hx    Esophageal cancer Neg Hx    Rectal cancer Neg Hx    Stomach cancer Neg Hx     Social History   Socioeconomic History   Marital  status: Married    Spouse name: Not on file   Number of children: Not on file   Years of education: Not on file   Highest education level: Not on file  Occupational History   Not on file  Tobacco Use   Smoking status: Former    Current packs/day: 0.00    Average packs/day: 1 pack/day for 20.0 years (20.0 ttl pk-yrs)    Types: Cigarettes    Start date: 04/06/1983    Quit date: 04/06/2003    Years since quitting: 20.4   Smokeless tobacco: Never  Vaping Use   Vaping status: Never Used  Substance and Sexual Activity   Alcohol  use: No    Comment: rarely drinks   Drug use: No   Sexual activity: Yes    Comment: 1 partner in last 12 months  Other Topics Concern   Not on file  Social History Narrative   Not on file   Social Drivers of Health    Financial Resource Strain: Not on file  Food Insecurity: Not on file  Transportation Needs: Not on file  Physical Activity: Not on file  Stress: Not on file  Social Connections: Not on file  Intimate Partner Violence: Not on file    Review of Systems:  All other review of systems negative except as mentioned in the HPI.  Physical Exam: Vital signs in last 24 hours: BP (!) 150/79   Pulse 71   Temp 97.7 F (36.5 C) (Temporal)   Resp 14   Ht 5' 4 (1.626 m)   Wt 126 lb (57.2 kg)   SpO2 100%   BMI 21.63 kg/m  General:   Alert, NAD Lungs:  Clear .   Heart:  Regular rate and rhythm Abdomen:  Soft, nontender and nondistended. Neuro/Psych:  Alert and cooperative. Normal mood and affect. A and O x 3  Reviewed labs, radiology imaging, old records and pertinent past GI work up  Patient is appropriate for planned procedure(s) and anesthesia in an ambulatory setting   [name] , MD [phone]

## 2023-09-08 NOTE — Progress Notes (Signed)
0801 Robinul 0.1 mg IV given due large amount of secretions upon assessment.  MD made aware, vss  

## 2023-09-08 NOTE — Progress Notes (Signed)
 Pt's states no medical or surgical changes since previsit or office visit.

## 2023-09-12 ENCOUNTER — Telehealth: Payer: Self-pay

## 2023-09-12 NOTE — Telephone Encounter (Signed)
  Follow up Call-     09/08/2023    7:19 AM  Call back number  Post procedure Call Back phone  # [phone]  Permission to leave phone message Yes     Patient questions:  Do you have a fever, pain , or abdominal swelling? No. Pain Score  0 *  Have you tolerated food without any problems? Yes.    Have you been able to return to your normal activities? Yes.    Do you have any questions about your discharge instructions: Diet   No. Medications  No. Follow up visit  No.  Do you have questions or concerns about your Care? No.  Actions: * If pain score is 4 or above: No action needed, pain <4.

## 2023-09-27 ENCOUNTER — Other Ambulatory Visit: Payer: Self-pay | Admitting: Internal Medicine

## 2023-09-27 DIAGNOSIS — Z1231 Encounter for screening mammogram for malignant neoplasm of breast: Secondary | ICD-10-CM

## 2023-11-06 ENCOUNTER — Ambulatory Visit
Admission: RE | Admit: 2023-11-06 | Discharge: 2023-11-06 | Disposition: A | Discharge status: Home / Self Care | Source: Ambulatory Visit | Attending: Internal Medicine | Admitting: Internal Medicine

## 2023-11-06 ENCOUNTER — Ambulatory Visit

## 2023-11-06 DIAGNOSIS — Z1231 Encounter for screening mammogram for malignant neoplasm of breast: Secondary | ICD-10-CM

## 2023-11-27 DIAGNOSIS — H04123 Dry eye syndrome of bilateral lacrimal glands: Secondary | ICD-10-CM | POA: Diagnosis not present

## 2023-11-27 DIAGNOSIS — H2513 Age-related nuclear cataract, bilateral: Secondary | ICD-10-CM | POA: Diagnosis not present
